# Patient Record
Sex: Female | Born: 1955
Health system: Southern US, Community
[De-identification: ages and names within clinical notes are randomized; demographics above are authoritative.]

## PROBLEM LIST (undated history)

## (undated) DIAGNOSIS — M069 Rheumatoid arthritis, unspecified: Secondary | ICD-10-CM

## (undated) DIAGNOSIS — I10 Essential (primary) hypertension: Secondary | ICD-10-CM

## (undated) DIAGNOSIS — M25579 Pain in unspecified ankle and joints of unspecified foot: Secondary | ICD-10-CM

## (undated) DIAGNOSIS — K219 Gastro-esophageal reflux disease without esophagitis: Secondary | ICD-10-CM

## (undated) DIAGNOSIS — E785 Hyperlipidemia, unspecified: Secondary | ICD-10-CM

## (undated) DIAGNOSIS — E063 Autoimmune thyroiditis: Secondary | ICD-10-CM

## (undated) DIAGNOSIS — E119 Type 2 diabetes mellitus without complications: Secondary | ICD-10-CM

## (undated) DIAGNOSIS — E039 Hypothyroidism, unspecified: Secondary | ICD-10-CM

## (undated) DIAGNOSIS — E559 Vitamin D deficiency, unspecified: Secondary | ICD-10-CM

## (undated) DIAGNOSIS — N200 Calculus of kidney: Secondary | ICD-10-CM

## (undated) HISTORY — DX: Vitamin D deficiency, unspecified: E55.9

## (undated) HISTORY — DX: Autoimmune thyroiditis: E06.3

## (undated) HISTORY — DX: Hyperlipidemia, unspecified: E78.5

## (undated) HISTORY — DX: Essential (primary) hypertension: I10

## (undated) HISTORY — PX: CARPAL TUNNEL RELEASE: SHX101

## (undated) HISTORY — DX: Gastro-esophageal reflux disease without esophagitis: K21.9

## (undated) HISTORY — PX: CHOLECYSTECTOMY: SHX55

## (undated) HISTORY — DX: Rheumatoid arthritis, unspecified: M06.9

## (undated) HISTORY — DX: Pain in unspecified ankle and joints of unspecified foot: M25.579

## (undated) HISTORY — DX: Type 2 diabetes mellitus without complications: E11.9

## (undated) HISTORY — PX: OTHER SURGICAL HISTORY: SHX169

## (undated) HISTORY — DX: Hypothyroidism, unspecified: E03.9

## (undated) HISTORY — PX: TONSILLECTOMY: SUR1361

## (undated) HISTORY — DX: Calculus of kidney: N20.0

## (undated) HISTORY — PX: HEMORRHOID SURGERY: SHX153

## (undated) HISTORY — PX: EXPLORATORY LAPAROTOMY: SUR591

---

## 2008-05-06 ENCOUNTER — Ambulatory Visit (HOSPITAL_BASED_OUTPATIENT_CLINIC_OR_DEPARTMENT_OTHER): Admission: RE | Admit: 2008-05-06 | Discharge: 2008-05-06 | Payer: Self-pay | Admitting: Orthopedic Surgery

## 2008-05-27 ENCOUNTER — Ambulatory Visit (HOSPITAL_BASED_OUTPATIENT_CLINIC_OR_DEPARTMENT_OTHER): Admission: RE | Admit: 2008-05-27 | Discharge: 2008-05-27 | Payer: Self-pay | Admitting: Orthopedic Surgery

## 2011-04-02 NOTE — Op Note (Signed)
NAME:  Stephanie [name], Stephanie [name]               ACCOUNT NO.:  [id number]   MEDICAL RECORD NO.:  [id number]          PATIENT TYPE:  AMB   LOCATION:  DSC                          FACILITY:  [facility]   PHYSICIAN:  Stephanie Fitch. [name], M.D. DATE OF BIRTH:  October 20, 1956   DATE OF PROCEDURE:  05/06/2008  DATE OF DISCHARGE:                               OPERATIVE REPORT   PREOPERATIVE DIAGNOSES:  1. Entrapped neuropathy, median nerve.  2. Right carpal tunnel.   POSTOPERATIVE DIAGNOSES:  1. Entrapped neuropathy, median nerve.  2. Right carpal tunnel.   OPERATION:  Release of right transverse carpal ligament.   [name] SURGEON:  Stephanie Fitch. Sypher, MD.   ASSISTANT:  Stephanie Rusk, PA-C.   ANESTHESIA:  IV regional supplemented by IV sedation.   SUPERVISING ANESTHESIOLOGIST:  Stephanie Sell, MD.   INDICATIONS:  Stephanie [name] is a 55 year old woman referred for  evaluation and management of right hand numbness.   Clinical examination revealed signs of carpal tunnel syndrome.  Electrodiagnostic studies confirmed significant carpal tunnel syndrome.   Due to failed respond to nonoperative measures, she was [name] to the  [name] room at this time for release of her right transcarpal  ligament.   Preoperatively, she was interviewed by Stephanie [name].   Due to a history of significant coughing following prior anesthesia, Dr.  Sampson [name] discussed the risks and benefits of general anesthesia versus  regional anesthesia with Stephanie [name] that she  would [name] an IV regional block.   A decision was made to proceed with an IV regional block with the  tourniquet being placed in the proximal forearm level.   Stephanie [name] and all parties understand that this sometimes can be  problematic with respect to intraoperative bleeding.   After informed consent, Stephanie [name] at this  time.   PROCEDURE:  Stephanie [name] is [name] to the [name] room and  placed  supine position on the [name] table.   An IV regional block was placed at approximately forearm level with a  tourniquet pressure of 250 mmHg.   After approximately 10 minutes, excellent anesthesia was achieved.   After routine Betadine scrub and paint, we proceeded with a short  incision in the line of the ring finger of the palm.  Subcutaneous  tissues were carefully divided in the palmar fascia.  The palmar fascia  was split longitudinally to reveal the common sensory branch of median  nerve and the ulnar bursa.  The superficial palmar arch was identified.   The distal margin of the transcarpal ligament was released with scissors  followed by release of the fascia overlying the thenar musculature.  There was a horseshoe muscle superficial to the transcarpal ligament.  This was gently teased apart with scissors.  Teasing the muscle led to  some bleeding.   As this is typical of regional blocks, we were required to repeatedly  sponge away oozing blood from the field.  The transcarpal ligament was  also ultimately isolated and released with scissors subcutaneously  extending into the distal forearm.   A Sewall retractor  was placed allowing visualization of the ulnar bursa.  The bursa was quite fibrotic.  The wound was noted to have proximal  oozing.  This will be controlled by direct compression.   Bleeding points of visible vessel lumens were electrocauterized bipolar  current followed by repair of the wound with intradermal 3-0 Prolene  suture.   The thumb was released with immediate capillary refill of the fingers  and thumb.  Direct compression was applied to the volar aspect of the  wrist to control venous ooze for approximately 5 minutes.  A compressive  dressing was applied with a volar plaster splint maintaining wrist in 5  degrees of dorsiflexion.      Stephanie [name], M.D.  Electronically Signed     RVS/MEDQ  D:  05/06/2008  T:  05/06/2008  Job:   [id number]

## 2011-04-02 NOTE — Op Note (Signed)
Stephanie [name]               ACCOUNT NO.:  [id number]   MEDICAL RECORD NO.:  [id number]          PATIENT TYPE:  AMB   LOCATION:  DSC                          FACILITY:  [facility]   PHYSICIAN:  Stephanie [name], M.D. DATE OF BIRTH:  [birth year]   DATE OF PROCEDURE:  05/27/2008  DATE OF DISCHARGE:                               OPERATIVE REPORT   PREOPERATIVE DIAGNOSES:  1. Left carpal tunnel syndrome.  2. Ganglion cyst, left thumb CMC joint dorsal.  3. Small ganglion cyst, left first dorsal compartment without signs of      no stenosing tenosynovitis.   POSTOPERATIVE DIAGNOSES:  1. Left carpal tunnel syndrome.  2. Ganglion cyst, left thumb CMC joint dorsal.  3. Small ganglion cyst, left first dorsal compartment without signs of      no stenosing tenosynovitis.   OPERATION:  1. Release of left transcarpal ligament.  2. Excision of ganglion cyst, left thumb CMC and thenar muscle region.  3. Incidental removal of ganglion, left first dorsal compartment.   OPERATIONS:  Stephanie Fitch. Sypher, MD   ASSISTANT:  Stephanie Reeks Dasnoit, PA.   ANESTHESIA:  IV regional at proximal brachial level supplemented by IV  sedation.   SUPERVISING ANESTHESIOLOGIST:  Stephanie Person, MD   INDICATIONS:  Stephanie [name] is a 55 year old woman employed by [employer] at [place], [place] who presents for evaluation of left hand  numbness and cyst at the base of her left thumb metacarpal dorsally and  overlying her first dorsal compartment.   She has had previous right carpal tunnel release surgery with a very  satisfactory improvement in her hand numbness.  She is now returned to  the operating room at this time for release of her left transcarpal  ligament.   Preoperatively, she also requested that we remove some cysts she was  growing one at the base of her thumb at the carpometacarpal joint and  thenar muscle junction and second over the first dorsal compartment.   Careful examination of her wrist  revealed a negative Finkelstein's  maneuver.  Her cysts were easily palpable.   After informed consent, she was brought to the operating room at this  time.  Preoperatively, she was advised that ganglion cyst can recur  despite thorough debridement.   PROCEDURE:  Stephanie [name] was brought to the operating room and placed  in supine position on the operating table.   Following placement of an IV regional block on the left at brachial  level, the left arm was prepped with Betadine soap solution and  sterilely draped.  IV sedation was provided.   The procedure commenced with a short incision in the line of the ring  finger and the palm.  Subcutaneous tissues were carefully divided  revealing palmar fascia.  They split longitudinally to reveal the common  sensory branches of the median nerve.  These were followed to transverse  carpal ligament, which was gently isolated from the median nerve.  The  median nerve was gently separated from the transcarpal ligament with the  aid of a Penfield four Engineer, structural.  The ligament was  then released along  its ulnar border with scissors extending into the distal forearm.   Bleeding points along the margin of the released ligament were  electrocauterized with bipolar current followed by repair of the skin  with intradermal through a Prolene suture.  Attention then directed to  the dorsal aspect of the thumb carpometacarpal joint.  The cyst was  easily palpable.  A short longitudinal incision of 1 cm fashioned.  The  cyst was circumferentially dissected and neurovascular structures gently  teased off with a Presenter, broadcasting.  The cyst was circumferentially  dissected and removed with a rongeur.  This extended to the capsule of  the [facility] joint and deep to the thenar muscles.   The base of the cyst was electrocauterized with bipolar forceps.   This wound was then repaired with intradermal through a Prolene suture.   Attention then directed to the  first dorsal compartment.  A short  transverse incision was fashioned directly over the palpable cyst.  The  subcutaneous tissue was carefully divided, retracting the radial  superficial sensory branches.  The cyst was noted to be synovial tissues  extruding through the fibers of the first dorsal compartment.  This was  circumferentially dissected with scissors and removed the rongeur.  There was no sign of stenosing tenosynovitis.  Therefore, the first  dorsal compartment was not released.   The third wound was repaired with intradermal through Prolene and Steri-  Strips.   All wounds were infiltrated with 2% lidocaine for postoperative  analgesia.   A hand dressing was applied with a volar plaster splint maintaining the  wrist in 5 degrees of dorsiflexion.   For aftercare Stephanie [name] was discharged with a prescription for Darvocet-  N 100 one p.o. q.4-6 h. p.r.n. pain 20 tablets without refill.  We will  see her back for followup at our office in 1 week for dressing change  and initiation of her exercise program.      Stephanie [name], M.D.  Electronically Signed     RVS/MEDQ  D:  05/27/2008  T:  05/28/2008  Job:  [id number]   cc:   Stephanie [name]

## 2011-08-15 LAB — POCT HEMOGLOBIN-HEMACUE: Hemoglobin: 16.5 — ABNORMAL HIGH

## 2011-08-15 LAB — BASIC METABOLIC PANEL
BUN: 8
Chloride: 103
Creatinine, Ser: 0.97
Glucose, Bld: 170 — ABNORMAL HIGH
Potassium: 3.4 — ABNORMAL LOW

## 2011-08-15 LAB — POCT I-STAT, CHEM 8
Calcium, Ion: 1.1 — ABNORMAL LOW
Chloride: 104
Creatinine, Ser: 1
Glucose, Bld: 110 — ABNORMAL HIGH
HCT: 42

## 2015-03-07 ENCOUNTER — Ambulatory Visit (INDEPENDENT_AMBULATORY_CARE_PROVIDER_SITE_OTHER): Payer: BLUE CROSS/BLUE SHIELD | Admitting: Podiatry

## 2015-03-07 ENCOUNTER — Encounter: Payer: Self-pay | Admitting: Podiatry

## 2015-03-07 ENCOUNTER — Ambulatory Visit (INDEPENDENT_AMBULATORY_CARE_PROVIDER_SITE_OTHER): Payer: BLUE CROSS/BLUE SHIELD

## 2015-03-07 VITALS — BP 108/65 | HR 89 | Resp 16 | Ht 62.0 in | Wt 128.0 lb

## 2015-03-07 DIAGNOSIS — M216X1 Other acquired deformities of right foot: Secondary | ICD-10-CM | POA: Diagnosis not present

## 2015-03-07 DIAGNOSIS — M779 Enthesopathy, unspecified: Secondary | ICD-10-CM

## 2015-03-07 DIAGNOSIS — E1142 Type 2 diabetes mellitus with diabetic polyneuropathy: Secondary | ICD-10-CM

## 2015-03-07 NOTE — Progress Notes (Signed)
   Subjective:    Patient ID: Stephanie [name], female    DOB: 10/20/1956, 59 y.o.   MRN: [id number]  HPI Comments: "I have trouble with this right foot"  Patient c/o numbness and tenderness plantar forefoot and toes right foot for about 1 year. Notices her toes don't touch the floor when she walks. Feels like there is a ball under the toes. She did see a doc and recommended she start "splaying" the toes. Tried wearing sneakers more often. The left is beginning to look the same but not as symptomatic yet.   Diabetic x 6 years and last A1C was 8.0     Review of Systems  All other systems reviewed and are negative.      Objective:   Physical Exam: I have reviewed her past mental history medications allergies surgery social history and review of systems. Pulses are strongly palpable bilateral. Neurologic sensorium is decreased persons Weinstein monofilament bilateral. She also has some changes in vibratory sensation bilateral. Muscle strength +5 over 5 dorsiflexors and plantar flexors inverters and everters are slightly weaker. This is noted but to be the the same bilateral. Orthopedic evaluation demonstrates mild pes planus bilateral mild valgus rotation to the second and third digits of the right foot possibly associated with overpronation or muscle atrophy within the intrinsic musculature. She also has some swelling beneath the second and third metatarsophalangeal joints that is unexplained. Radiographs do not demonstrate any fractures. Patient does state that she has loss of balance and fatigue in her legs. Cutaneous evaluation demonstrates supple well-hydrated cutis no lesions noted.        Assessment & Plan:  Assessment: I'm concerned for diabetic polyneuropathy resulting in atrophy/neuromuscular atrophy of the right foot. I'm also concerned about her instability.  Plan: I'm referring her to heal for neurology for a nerve conduction velocity exam as well as a EMG. I will follow-up with  her once we receive the results.

## 2015-03-09 ENCOUNTER — Telehealth: Payer: Self-pay | Admitting: *Deleted

## 2015-03-09 NOTE — Telephone Encounter (Signed)
"  Dr. Milinda Pointer ordered a Nerve Conduction Study for me.  Does it take a week for them to give me a call to schedule it?"  Dr. Milinda Pointer just placed the order on the 19th when you were here.  They should give you call soon.  "That's why I said does it take a week to hear from them.  Okay, that's all I wanted to know."

## 2015-03-30 ENCOUNTER — Encounter: Payer: Self-pay | Admitting: Neurology

## 2015-03-30 ENCOUNTER — Ambulatory Visit (INDEPENDENT_AMBULATORY_CARE_PROVIDER_SITE_OTHER): Payer: Self-pay | Admitting: Neurology

## 2015-03-30 ENCOUNTER — Ambulatory Visit (INDEPENDENT_AMBULATORY_CARE_PROVIDER_SITE_OTHER): Payer: BLUE CROSS/BLUE SHIELD | Admitting: Neurology

## 2015-03-30 DIAGNOSIS — M25579 Pain in unspecified ankle and joints of unspecified foot: Secondary | ICD-10-CM

## 2015-03-30 DIAGNOSIS — M25571 Pain in right ankle and joints of right foot: Secondary | ICD-10-CM

## 2015-03-30 DIAGNOSIS — M79676 Pain in unspecified toe(s): Secondary | ICD-10-CM | POA: Insufficient documentation

## 2015-03-30 DIAGNOSIS — M779 Enthesopathy, unspecified: Secondary | ICD-10-CM

## 2015-03-30 HISTORY — DX: Pain in unspecified ankle and joints of unspecified foot: M25.579

## 2015-03-30 NOTE — Procedures (Signed)
     HISTORY: Stephanie [name] is a 59 year old patient with a history of diabetes that is poorly controlled. The patient is having some burning sensations on the top of the foot on the right, and a sensation of fullness on the ball the foot on the right. The patient denies any low back pain or pain down the right leg. The patient is being evaluated for possible neuropathy or a lumbosacral radiculopathy.  NERVE CONDUCTION STUDIES:  Nerve conduction studies were performed on both lower extremities. The distal motor latencies and motor amplitudes for the peroneal and posterior tibial nerves were within normal limits, with exception that the motor amplitude for the right peroneal nerve was slightly low. The nerve conduction velocities for these nerves were also normal. The H reflex latencies were normal. The sensory latencies for the peroneal nerves were within normal limits. The medial and lateral plantar sensory latencies were symmetric and normal.   EMG STUDIES:  EMG study was performed on the right lower extremity:  The tibialis anterior muscle reveals 2 to 4K motor units with full recruitment. No fibrillations or positive waves were seen. The peroneus tertius muscle reveals 2 to 4K motor units with full recruitment. No fibrillations or positive waves were seen. The medial gastrocnemius muscle reveals 1 to 3K motor units with full recruitment. No fibrillations or positive waves were seen. The vastus lateralis muscle reveals 2 to 4K motor units with full recruitment. No fibrillations or positive waves were seen. The iliopsoas muscle reveals 2 to 4K motor units with full recruitment. No fibrillations or positive waves were seen. The biceps femoris muscle (long head) reveals 2 to 4K motor units with full recruitment. No fibrillations or positive waves were seen. The lumbosacral paraspinal muscles were tested at 3 levels, and revealed no abnormalities of insertional activity at all 3 levels tested.  There was good relaxation.   IMPRESSION:  Nerve conduction studies done on both lower extremities is relatively unremarkable, with exception that there is a slightly low motor amplitude for the right peroneal nerve. There is no evidence of a peripheral neuropathy. EMG evaluation of the right lower extremity is unremarkable, without evidence of an overlying lumbosacral radiculopathy.  Jill Alexanders MD 03/30/2015 4:12 PM  [facility] [address] [place], [place] [address]  Phone [phone] Fax [phone]

## 2015-03-30 NOTE — Progress Notes (Signed)
Please refer to EMG and nerve conduction study procedure note. 

## 2015-04-11 ENCOUNTER — Telehealth: Payer: Self-pay | Admitting: *Deleted

## 2015-04-11 NOTE — Telephone Encounter (Addendum)
"  I'm calling for the results of my Nerve Conduction Study.  I've been waiting patiently.  I don't think I should have to pay a bill for something I haven't received the results for."  I have the results.  I'm not sure whether Dr. Milinda Pointer has seen the results but I will ask him to read it and will call you back with the results or recommendation of what he wants you to do."  "I'm getting ready to leave for the Endocrinologist that he suggested.  So, I will be gone for about a hour.  I'll just continue to call to find out what is going on."  If you are not available, I will keep trying to contact you."  I called and informed her that Dr. Milinda Pointer said the study came back negative.  He said if the pain persists to schedule an appointment to come an see him.  Would you like to schedule an appointment?  "My foot hurts every minute, it never stopped.  Yes, I'd like to schedule an appointment please."  I transferred patient to a scheduler.

## 2015-04-18 ENCOUNTER — Encounter: Payer: Self-pay | Admitting: Podiatry

## 2015-04-18 ENCOUNTER — Ambulatory Visit (INDEPENDENT_AMBULATORY_CARE_PROVIDER_SITE_OTHER): Payer: BLUE CROSS/BLUE SHIELD | Admitting: Podiatry

## 2015-04-18 VITALS — BP 113/67 | HR 78 | Resp 16

## 2015-04-18 DIAGNOSIS — E1142 Type 2 diabetes mellitus with diabetic polyneuropathy: Secondary | ICD-10-CM

## 2015-04-18 NOTE — Progress Notes (Signed)
She presents today for follow-up of pain to the right foot which she describes more as a numbness and oxygen saturation to the first 3 toes and the plantar aspect of the first ray metatarsophalangeal joints. She is also here for evaluation of her nerve conduction velocity exam.  Objective: Vital signs are stable she is alert and oriented 3. No change in clinical findings. Nerve conduction velocity exam and EMG were both negative.  Assessment: Idiopathic neuropathy right.  Plan: I offered her Metanx and gabapentin and she declined. She states that she would wait until her foot got worse before she started any medication and treatment. She would just like to know what is wrong with her foot I expressed to her that it is neuromuscular and unfortunately we cannot diagnose it with nerve conduction velocity exam.

## 2016-06-12 ENCOUNTER — Telehealth: Payer: Self-pay | Admitting: *Deleted

## 2016-06-12 NOTE — Telephone Encounter (Signed)
"  My name is Jaryn Caughell.  My phone number is [phone].  Thank you."

## 2016-06-12 NOTE — Telephone Encounter (Signed)
Patient was calling regarding her husband.

## 2016-12-31 DIAGNOSIS — E1165 Type 2 diabetes mellitus with hyperglycemia: Secondary | ICD-10-CM | POA: Insufficient documentation

## 2016-12-31 DIAGNOSIS — E559 Vitamin D deficiency, unspecified: Secondary | ICD-10-CM | POA: Insufficient documentation

## 2016-12-31 DIAGNOSIS — E063 Autoimmune thyroiditis: Secondary | ICD-10-CM | POA: Insufficient documentation

## 2016-12-31 DIAGNOSIS — E039 Hypothyroidism, unspecified: Secondary | ICD-10-CM | POA: Insufficient documentation

## 2016-12-31 DIAGNOSIS — E782 Mixed hyperlipidemia: Secondary | ICD-10-CM | POA: Insufficient documentation

## 2016-12-31 DIAGNOSIS — I10 Essential (primary) hypertension: Secondary | ICD-10-CM | POA: Insufficient documentation

## 2018-06-26 ENCOUNTER — Other Ambulatory Visit: Payer: Self-pay | Admitting: Internal Medicine

## 2018-06-26 DIAGNOSIS — Z780 Asymptomatic menopausal state: Secondary | ICD-10-CM

## 2018-07-24 ENCOUNTER — Other Ambulatory Visit (HOSPITAL_COMMUNITY): Payer: Self-pay

## 2018-08-10 ENCOUNTER — Ambulatory Visit (HOSPITAL_COMMUNITY)
Admission: RE | Admit: 2018-08-10 | Discharge: 2018-08-10 | Disposition: A | Discharge status: Home / Self Care | Payer: BLUE CROSS/BLUE SHIELD | Source: Ambulatory Visit | Attending: Internal Medicine | Admitting: Internal Medicine

## 2018-08-10 ENCOUNTER — Encounter (HOSPITAL_COMMUNITY): Payer: Self-pay | Admitting: Radiology

## 2018-08-10 DIAGNOSIS — Z780 Asymptomatic menopausal state: Secondary | ICD-10-CM | POA: Insufficient documentation

## 2020-09-21 DIAGNOSIS — Z978 Presence of other specified devices: Secondary | ICD-10-CM | POA: Insufficient documentation

## 2021-01-26 ENCOUNTER — Encounter: Payer: Self-pay | Admitting: *Deleted

## 2021-02-15 ENCOUNTER — Ambulatory Visit (INDEPENDENT_AMBULATORY_CARE_PROVIDER_SITE_OTHER): Payer: Self-pay | Admitting: *Deleted

## 2021-02-15 ENCOUNTER — Encounter: Payer: Self-pay | Admitting: Nurse Practitioner

## 2021-02-15 ENCOUNTER — Other Ambulatory Visit: Payer: Self-pay

## 2021-02-15 VITALS — Ht 62.0 in | Wt 119.2 lb

## 2021-02-15 DIAGNOSIS — Z1211 Encounter for screening for malignant neoplasm of colon: Secondary | ICD-10-CM

## 2021-02-15 MED ORDER — CLENPIQ 10-3.5-12 MG-GM -GM/160ML PO SOLN: 1.0000 | Freq: Once | ORAL | 0 refills | Status: AC

## 2021-02-15 NOTE — Progress Notes (Signed)
Spoke with Walden Field, NP.  Pt will need ov due to rectal bleeding.

## 2021-02-15 NOTE — Patient Instructions (Signed)
[place]   Patient Name:  Stephanie [name] Date of procedure: 02/21/2021 Time to register at [facility]:  You will receive a call from the hospital a few days before your procedure. Provider:  Dr. Gala Romney  Please notify us immediately if you are diabetic, take iron supplements, or if you are on coumadin or any blood thinners.  Please hold the following medications: See letter.  Note: Do NOT refrigerate or freeze CLENPIQ. CLENPIQ is ready to drink. There is no need to add any other liquid or mix the medicine in the bottle before you start dosing.   02/20/2021-  1 Day prior to procedure:  Tuesday   CLEAR LIQUIDS ALL DAY--NO SOLID FOODS OR DAIRY PRODUCTS! See list of liquids that are allowed and items that are NOT allowed below.   Diabetic Medication Instructions:  See letter.   You must drink plenty of CLEAR LIQUIDS starting before your bowel prep. It is important to stay adequately hydrated before, during, and after your bowel prep for the prep to work effectively!   At 4:00 PM Begin the prep as follows:    1. Drink one bottle of pre-mixed CLENPIQ right from the bottle.  2. Drink at least five (5) 8-ounce drinks of clear liquids of your choice within the next 5 hours   At 10:00 PM: 1. Drink the second bottle of pre-mixed CLENPIQ right from the bottle.   2. Drink at least three (3) 8-ounce drinks of clear liquids of your choice within the next 3 hours before going to bed.   Continue clear liquids until midnight.  Nothing by mouth after midnight.  02/21/2021- Day of Procedure: Wednesday   Diabetic medications adjustments:  See letter.   You may take TYLENOL products.  Please continue your regular medications unless we have instructed you otherwise.     Please note, on the day of your procedure you MUST be accompanied by an adult who is willing to assume responsibility for you at time of discharge. If you do not have such person with you, your procedure will  have to be rescheduled.                                                                                                                     Please leave ALL jewelry at home prior to coming to the hospital for your procedure.   *It is your responsibility to check with your insurance company for the benefits of coverage you have for this procedure. Unfortunately, not all insurance companies have benefits to cover all or part of these types of procedures. It is your responsibility to check your benefits, however we will be glad to assist you with any codes your insurance company may need.   Please note that most insurance companies will not cover a screening colonoscopy for people under the age of 81  For example, with some insurance companies you may have benefits for a screening colonoscopy, but if polyps are found the diagnosis will  change and then you may have a deductible that will need to be met. Please make sure you check your benefits for screening colonoscopy as well as a diagnostic colonoscopy.    CLEAR LIQUIDS: (NO RED or PURPLE) Water  Jello   Apple Juice  White Grape Juice   Kool-Aid Soft drinks  Banana popsicles Sports Drink  Black coffee (No cream or milk) Tea (No cream or milk)  Broth (fat free beef/chicken/vegetable)  Clear liquids allow you to see your fingers on the other side of the glass.  Be sure they are NOT RED or PURPLE in color, cloudy, but CLEAR.  Do Not Eat: Dairy products of any kind Cranberry juice Tomato or V8 Juice  Orange Juice   Grapefruit Juice Red Grape Juice Alcohol   Non-dairy creamer Solid foods like cereal, oatmeal, yogurt, fruits, vegetables, creamed soups, eggs, bread, etc   HELPFUL HINTS TO MAKE DRINKING EASIER: -Trying drinking through a straw. -If you become nauseated, try consuming smaller amounts or stretch out the time between glasses.  Stop for 30 minutes & slowly start back drinking.  Call our office with any questions or concerns at  [phone].  Thank You,  Christ Kick, [place]

## 2021-02-15 NOTE — Progress Notes (Signed)
Pt said that she has history of bleeding from hemorrhoids for many years.  She said that she was instructed to use Miralax to help ( per physician that removed hemorrhoids-dr in [place] and Dr. Scotty Court).  Pt said that she hasn't used Miralax in a long time. Pt says that she has bleeding once a week.  She said it is usually seen after wiping.

## 2021-02-15 NOTE — Progress Notes (Signed)
Spoke with pt and informed her that ov was needed due to rectal bleeding.  Pt made aware that procedure and Covid screening is being cancelled and will be rescheduled at her ov on 03/28/2021 at 8:30 with Walden Field, NP.  Pt voiced understanding.  Pt requested a sooner appointment.  Informed her that I would call her if any cancellations.

## 2021-02-15 NOTE — Progress Notes (Signed)
Gastroenterology Pre-Procedure Review  Request Date: 02/15/2021 Requesting Physician: Dr. Wende Neighbors, Last TCS done 10 or more years ago in [place] per pt, she was unsure if she had polyps  PATIENT REVIEW QUESTIONS: The patient responded to the following health history questions as indicated:    1. Diabetes Melitis: yes, type II 2. Joint replacements in the past 12 months: no 3. Major health problems in the past 3 months: no 4. Has an artificial valve or MVP: no 5. Has a defibrillator: no 6. Has been advised in past to take antibiotics in advance of a procedure like teeth cleaning: no 7. Family history of colon cancer: no  8. Alcohol Use: no 9. Illicit drug Use: no 10. History of sleep apnea: no  11. History of coronary artery or other vascular stents placed within the last 12 months: no 12. History of any prior anesthesia complications: no 13. Body mass index is 21.8 kg/m.    MEDICATIONS & ALLERGIES:    Patient reports the following regarding taking any blood thinners:   Plavix? no Aspirin? no Coumadin? no Brilinta? no Xarelto? no Eliquis? no Pradaxa? no Savaysa? no Effient? no  Patient confirms/reports the following medications:  Current Outpatient Medications  Medication Sig Dispense Refill  . Adalimumab (HUMIRA PEN) 40 MG/0.4ML PNKT every 14 (fourteen) days.    . ATORVASTATIN CALCIUM PO Take by mouth daily.    . Cholecalciferol (VITAMIN D-3) 125 MCG (5000 UT) TABS Take by mouth 3 (three) times a week.    . cycloSPORINE (RESTASIS) 0.05 % ophthalmic emulsion 1 drop 2 (two) times daily. Instill 1 drop into both eyes twice a day.    . empagliflozin (JARDIANCE) 25 MG TABS tablet Take by mouth daily.    . Esomeprazole Magnesium (NEXIUM PO) Take by mouth daily.    . hydroxychloroquine (PLAQUENIL) 200 MG tablet 2 (two) times daily.    Marland Kitchen levothyroxine (SYNTHROID) 75 MCG tablet every morning.    Marland Kitchen lisinopril-hydrochlorothiazide (ZESTORETIC) 20-25 MG tablet Take 1 tablet by mouth  daily. Takes 1/2 tablet daily.    . meloxicam (MOBIC) 15 MG tablet Take 1 tablet by mouth daily.    . metFORMIN (GLUCOPHAGE-XR) 500 MG 24 hr tablet Take 1 tablet in morning and 2 tablets in PM for diabetes    . Omega-3 Fatty Acids (FISH OIL) 1200 MG CAPS Take by mouth 2 (two) times daily.    . potassium chloride (MICRO-K) 10 MEQ CR capsule Take by mouth daily.    . sertraline (ZOLOFT) 25 MG tablet daily.    . TRULICITY 4.5 YP/9.5KD SOPN once a week. Inject 4.5 mg weekly.    . valACYclovir (VALTREX) 500 MG tablet Take 1 tablet by mouth daily.     No current facility-administered medications for this visit.    Patient confirms/reports the following allergies:  No Known Allergies  No orders of the defined types were placed in this encounter.   AUTHORIZATION INFORMATION Primary Insurance: [place],  [place] #: [id number],  Group #: [id number] Pre-Cert / Josem Kaufmann required:  Pre-Cert / Auth #:   SCHEDULE INFORMATION: Procedure has been scheduled as follows:  Date: 02/21/2021, Time: AM procedure Location: [facility] with Dr. Gala Romney  This Gastroenterology Pre-Precedure Review Form is being routed to the following provider(s): Walden Field, NP

## 2021-02-15 NOTE — Progress Notes (Signed)
Ok to schedule.  DM meds: Jiardiance, Glucophage: None the morning of  On prep day: Check CBG ac and hs as well (if they normally check their blood sugar) as if the patient feels like their blood sugar is off. Can use soda, juice (that's in [place]) as needed for any low blood sugar.  Check CBG on arrival to endo unit.  ASA III (DM with a1c 7) based on limited history available in Epic

## 2021-02-19 ENCOUNTER — Other Ambulatory Visit (HOSPITAL_COMMUNITY): Payer: BC Managed Care – PPO

## 2021-03-28 ENCOUNTER — Ambulatory Visit: Payer: BC Managed Care – PPO | Admitting: Nurse Practitioner

## 2021-08-06 ENCOUNTER — Ambulatory Visit (INDEPENDENT_AMBULATORY_CARE_PROVIDER_SITE_OTHER): Payer: BC Managed Care – PPO | Admitting: Internal Medicine

## 2021-08-06 ENCOUNTER — Other Ambulatory Visit: Payer: Self-pay

## 2021-08-06 ENCOUNTER — Encounter: Payer: Self-pay | Admitting: Internal Medicine

## 2021-08-06 VITALS — BP 120/80 | HR 88 | Ht 62.0 in | Wt 120.4 lb

## 2021-08-06 DIAGNOSIS — E063 Autoimmune thyroiditis: Secondary | ICD-10-CM

## 2021-08-06 DIAGNOSIS — R739 Hyperglycemia, unspecified: Secondary | ICD-10-CM

## 2021-08-06 DIAGNOSIS — E119 Type 2 diabetes mellitus without complications: Secondary | ICD-10-CM | POA: Diagnosis not present

## 2021-08-06 LAB — POCT GLYCOSYLATED HEMOGLOBIN (HGB A1C): Hemoglobin A1C: 7.1 % — AB (ref 4.0–5.6)

## 2021-08-06 LAB — TSH: TSH: 1.32 u[IU]/mL (ref 0.35–5.50)

## 2021-08-06 LAB — LIPID PANEL
Cholesterol: 129 mg/dL (ref 0–200)
HDL: 63.4 mg/dL (ref >39.00)
LDL Cholesterol: 47 mg/dL (ref 0–99)
NonHDL: 65.59
Total CHOL/HDL Ratio: 2
Triglycerides: 92 mg/dL (ref 0.0–149.0)
VLDL: 18.4 mg/dL (ref 0.0–40.0)

## 2021-08-06 LAB — BASIC METABOLIC PANEL
BUN: 17 mg/dL (ref 6–23)
CO2: 28 mEq/L (ref 19–32)
Calcium: 9.8 mg/dL (ref 8.4–10.5)
Chloride: 98 mEq/L (ref 96–112)
Creatinine, Ser: 0.83 mg/dL (ref 0.40–1.20)
GFR: 74.33 mL/min (ref >60.00)
Glucose, Bld: 114 mg/dL — ABNORMAL HIGH (ref 70–99)
Potassium: 4.5 mEq/L (ref 3.5–5.1)
Sodium: 136 mEq/L (ref 135–145)

## 2021-08-06 LAB — MICROALBUMIN / CREATININE URINE RATIO
Creatinine,U: 35.9 mg/dL
Microalb Creat Ratio: 1.9 mg/g (ref 0.0–30.0)
Microalb, Ur: <0.7 mg/dL (ref 0.0–1.9)

## 2021-08-06 MED ORDER — EMPAGLIFLOZIN 25 MG PO TABS: 25.0000 mg | ORAL_TABLET | Freq: Every day | ORAL | 3 refills | Status: DC

## 2021-08-06 MED ORDER — METFORMIN HCL ER 500 MG PO TB24: 1000.0000 mg | ORAL_TABLET | Freq: Two times a day (BID) | ORAL | 3 refills | Status: DC

## 2021-08-06 MED ORDER — ATORVASTATIN CALCIUM 20 MG PO TABS
20.0000 mg | ORAL_TABLET | Freq: Every day | ORAL | 3 refills | Status: DC
Start: 2021-08-06 — End: 2021-08-06

## 2021-08-06 MED ORDER — TRULICITY 4.5 MG/0.5ML ~~LOC~~ SOAJ: 4.5000 mg | SUBCUTANEOUS | 3 refills | Status: DC

## 2021-08-06 MED ORDER — ATORVASTATIN CALCIUM 20 MG PO TABS: 20.0000 mg | ORAL_TABLET | Freq: Every day | ORAL | 3 refills | Status: DC

## 2021-08-06 MED ORDER — SYNTHROID 75 MCG PO TABS: 75.0000 ug | ORAL_TABLET | Freq: Every day | ORAL | 3 refills | Status: DC

## 2021-08-06 NOTE — Progress Notes (Signed)
Name: Stephanie Stephanie [name] Stephanie [name]  MRN/ DOB: [id number], 02-07-1956   Age/ Sex: 65 y.o., female    PCP: Celene Squibb, MD   Reason for Endocrinology Evaluation: Type 2 Diabetes Mellitus/ Hypothyroidism     Date of Initial Endocrinology Visit: 08/06/2021     PATIENT IDENTIFIER: Stephanie Stephanie [name] Stephanie [name] is a 65 y.o. female with a past medical history of T2DM, HTN, Hashimoto's Thyroiditis , RLS and RA . The patient presented for initial endocrinology clinic visit on 08/06/2021 for consultative assistance with her diabetes management.    HPI: Stephanie Stephanie [name] Stephanie [name] was    Diagnosed with gestational DM in 1988, was diagnosed with T2DM in 2005, she was started on Metformin at the time. GAD-65 negative in 2016 Prior Medications tried/Intolerance: Glipizide, Glyxambi Currently checking blood sugars multiple x / day Hypoglycemia episodes : no               Hemoglobin A1c has ranged from 6.1 %in 2015, peaking at 7.1% in 2022. Patient required assistance for hypoglycemia: no  Patient has required hospitalization within the last 1 year from hyper or hypoglycemia: no  In terms of diet, the patient eats 3 meals a day .   She has nausea with hunger pain    Follow with Rheumatology Dr. Trudie Reed for RA . Recent reduction to Plaquanil     THYROID HISTORY: She has been diagnosed with Hashimoto's Thyroiditis . Synthroid started in 2016  Not on Biotin    HOME ENDOCRINE REGIMEN: Metformin 500 mg XR 1 tabs in the morning and 2 tablet at night  Trulicity 4.5 mg weekly  Jardiance 25 mg daily  Synthroid 75 mcg daily      Statin: yes ACE-I/ARB: yes    CONTINUOUS GLUCOSE MONITORING RECORD INTERPRETATION    Dates of Recording: 9/6-9/19/2022  Sensor description: freestyle libre  Results statistics:   CGM use % of time 52  Average and SD 172/20.3  Time in range  67      %  % Time Above 180 29  % Time above 250 4  % Time Below target 0     Glycemic patterns summary: postprandial hyperglycemia noted, optimal  BG's between meals   Hyperglycemic episodes  post prandial   Hypoglycemic episodes occurred n/a  Overnight periods: optimal        DIABETIC COMPLICATIONS: Microvascular complications:   Denies: retinopathy, neuropathy, CKD  Last eye exam: Completed 03/2021-  Dr. Herbert Stephanie [name]   Macrovascular complications:   Denies: CAD, PVD, CVA   PAST HISTORY: Past Medical History:  Past Medical History:  Diagnosis Date   Diabetes ([place])    Dyslipidemia    GERD (gastroesophageal reflux disease)    Hashimoto's thyroiditis    Hypertension    Hypothyroidism    Pain in joint, ankle and foot 03/30/2015   Vitamin D deficiency    Past Surgical History: No past surgical history on file.  Social History:  reports that she has never smoked. She has never used smokeless tobacco. She reports that she does not drink alcohol. No history on file for drug use. Family History: No family history on file.   HOME MEDICATIONS: Allergies as of 08/06/2021   No Known Allergies      Medication List        Accurate as of August 06, 2021  9:43 AM. If you have any questions, ask your nurse or doctor.          ATORVASTATIN CALCIUM PO Take by mouth daily.   cycloSPORINE 0.05 %  ophthalmic emulsion Commonly known as: RESTASIS 1 drop 2 (two) times daily. Instill 1 drop into both eyes twice a day.   empagliflozin 25 MG Tabs tablet Commonly known as: JARDIANCE Take by mouth daily.   Fish Oil 1200 MG Caps Take by mouth 2 (two) times daily.   Humira Pen 40 MG/0.4ML Pnkt Generic drug: Adalimumab every 14 (fourteen) days.   hydroxychloroquine 200 MG tablet Commonly known as: PLAQUENIL 2 (two) times daily.   levothyroxine 75 MCG tablet Commonly known as: SYNTHROID every morning.   lisinopril-hydrochlorothiazide 20-25 MG tablet Commonly known as: ZESTORETIC Take 1 tablet by mouth daily. Takes 1/2 tablet daily.   meloxicam 15 MG tablet Commonly known as: MOBIC Take 1 tablet by mouth daily.    metFORMIN 500 MG 24 hr tablet Commonly known as: GLUCOPHAGE-XR Take 1 tablet in morning and 2 tablets in PM for diabetes   NEXIUM PO Take by mouth daily.   potassium chloride 10 MEQ CR capsule Commonly known as: MICRO-K Take by mouth daily.   sertraline 25 MG tablet Commonly known as: ZOLOFT daily.   Trulicity 4.5 0000000 Sopn Generic drug: Dulaglutide once a week. Inject 4.5 mg weekly.   valACYclovir 500 MG tablet Commonly known as: VALTREX Take 1 tablet by mouth daily.   Vitamin D-3 125 MCG (5000 UT) Tabs Take by mouth 3 (three) times a week.         ALLERGIES: No Known Allergies   REVIEW OF SYSTEMS: A comprehensive ROS was conducted with the patient and is negative except as per HPI and below:  Review of Systems  Gastrointestinal:  Negative for diarrhea, nausea and vomiting.     OBJECTIVE:   VITAL SIGNS: BP 120/80 (BP Location: Left Arm, Patient Position: Sitting, Cuff Size: Small)   Pulse 88   Ht '5\' 2"'$  (1.575 m)   Wt 120 lb 6.4 oz (54.6 kg)   SpO2 97%   BMI 22.02 kg/m    PHYSICAL EXAM:  General: Pt appears well and is in NAD  Neck: General: Supple without adenopathy or carotid bruits. Thyroid: Thyroid size normal.  No goiter or nodules appreciated.   Lungs: Clear with good BS bilat with no rales, rhonchi, or wheezes  Heart: RRR with normal S1 and S2 and no gallops; no murmurs; no rub  Abdomen: Normoactive bowel sounds, soft, nontender, without masses or organomegaly palpable  Extremities:  Lower extremities - No pretibial edema. No lesions.  Neuro: MS is good with appropriate affect, pt is alert and Ox3    DM foot exam: 08/06/2021  The skin of the feet is intact without sores or ulcerations. The pedal pulses are 2+ on right and 2+ on left. The sensation is intact to a screening 5.07, 10 gram monofilament bilaterally   DATA REVIEWED:  Lab Results  Component Value Date   HGBA1C 7.1 (A) 08/06/2021    [name] Stephanie [name], Stephanie [name] (MRN  [id number]) as of 08/06/2021 14:44  Ref. Range 08/06/2021 10:18  Sodium Latest Ref Range: 135 - 145 mEq/L 136  Potassium Latest Ref Range: 3.5 - 5.1 mEq/L 4.5  Chloride Latest Ref Range: 96 - 112 mEq/L 98  CO2 Latest Ref Range: 19 - 32 mEq/L 28  Glucose Latest Ref Range: 70 - 99 mg/dL 114 (H)  BUN Latest Ref Range: 6 - 23 mg/dL 17  Creatinine Latest Ref Range: 0.40 - 1.20 mg/dL 0.83  Calcium Latest Ref Range: 8.4 - 10.5 mg/dL 9.8  GFR Latest Ref Range: >60.00 mL/min 74.33  Total  CHOL/HDL Ratio Unknown 2  Cholesterol Latest Ref Range: 0 - 200 mg/dL 129  HDL Cholesterol Latest Ref Range: >39.00 mg/dL 63.40  LDL (calc) Latest Ref Range: 0 - 99 mg/dL 47  MICROALB/CREAT RATIO Latest Ref Range: 0.0 - 30.0 mg/g 1.9  NonHDL Unknown 65.59  Triglycerides Latest Ref Range: 0.0 - 149.0 mg/dL 92.0  VLDL Latest Ref Range: 0.0 - 40.0 mg/dL 18.4  TSH Latest Ref Range: 0.35 - 5.50 uIU/mL 1.32  Creatinine,U Latest Units: mg/dL 35.9  Microalb, Ur Latest Ref Range: 0.0 - 1.9 mg/dL <0.7   ASSESSMENT / PLAN / RECOMMENDATIONS:   1) Type 2 Diabetes Mellitus, Optimally controlled, Without complications - Most recent A1c of 7.1 %. Goal A1c < 7.0 %.     -Historically her A1c has been at or close to goal.  She just needs to make minor adjustment to her CHO intake -In review of her CGM download she has been noted with postprandial hyperglycemia for breakfast and supper, she is motivated to reduce her CHO intake during these meals -In the meantime I am going to increase her metformin to max dose of 2000 mg daily  -If she continues with hyperglycemia we will consider sulfonylurea  MEDICATIONS:  Increase Metformin 500 mg 2 tablets with Breakfast and 2 tablets with Supper - Continue Jardiance 25 mg, 1 tablet in the morning  - Continue Trulicity 4.5 mg weekly   EDUCATION / INSTRUCTIONS: BG monitoring instructions: Patient is instructed to check her blood sugars 3 times a day, before meals . Call [place]  Endocrinology clinic if: BG persistently < 70  I reviewed the Rule of 15 for the treatment of hypoglycemia in detail with the patient. Literature supplied.   2) Diabetic complications:  Eye: Does not have known diabetic retinopathy.  Neuro/ Feet: Does not have known diabetic peripheral neuropathy. Renal: Patient does not have known baseline CKD. She is  not on an ACEI/ARB at present.  3) Hashimoto's Thyroiditis   - Pt educated extensively on the correct way to take levothyroxine (first thing in the morning with water, 30 minutes before eating or taking other medications). - Pt encouraged to double dose the following day if she were to miss a dose given long half-life of levothyroxine.  -TSH normal  Medication  Synthroid 75 mcg   4) Dyslipidemia:  -LDL and TG at goal, continue atorvastatin   Medication  Atorvastatin 20 mg daily    Signed electronically by: Mack Guise, MD  [facility] Endocrinology  [facility] [address], [address], [place] [address] Phone: [phone] FAX: [phone]   CC: Celene Squibb, MD [place] [address] Phone: [phone]  Fax: [phone]    Return to Endocrinology clinic as below: Future Appointments  Date Time Provider [place]  08/06/2021  9:50 AM , Melanie Crazier, MD [facility] None

## 2021-08-06 NOTE — Patient Instructions (Signed)
-   Increase Metformin 500 mg 2 tablets with Breakfast and 2 tablets with Supper - Continue Jardiance 25 mg, 1 tablet in the morning  - Continue Trulicity 4.5 mg weekly     HOW TO TREAT LOW BLOOD SUGARS (Blood sugar LESS THAN 70 MG/DL) Please follow the RULE OF 15 for the treatment of hypoglycemia treatment (when your (blood sugars are less than 70 mg/dL)   STEP 1: Take 15 grams of carbohydrates when your blood sugar is low, which includes:  3-4 GLUCOSE TABS  OR 3-4 OZ OF JUICE OR REGULAR SODA OR ONE TUBE OF GLUCOSE GEL    STEP 2: RECHECK blood sugar in 15 MINUTES STEP 3: If your blood sugar is still low at the 15 minute recheck --> then, go back to STEP 1 and treat AGAIN with another 15 grams of carbohydrates.

## 2021-08-10 ENCOUNTER — Telehealth: Payer: Self-pay | Admitting: Internal Medicine

## 2021-08-10 NOTE — Telephone Encounter (Signed)
Pt calling in inquiring about  why her atorvastatin (LIPITOR) 20 MG tablet was increased from 10mg  to 20mg . Please call patient [phone]. Pt did not pick up 20mg  Atorvastatin and needs 10mg  called into [facility].

## 2021-08-13 ENCOUNTER — Other Ambulatory Visit: Payer: Self-pay | Admitting: Internal Medicine

## 2021-08-13 ENCOUNTER — Telehealth: Payer: Self-pay

## 2021-08-13 MED ORDER — LISINOPRIL-HYDROCHLOROTHIAZIDE 20-25 MG PO TABS: 1.0000 | ORAL_TABLET | Freq: Every day | ORAL | 0 refills | Status: DC

## 2021-08-13 MED ORDER — ATORVASTATIN CALCIUM 10 MG PO TABS: 10.0000 mg | ORAL_TABLET | Freq: Every day | ORAL | 3 refills | Status: DC

## 2021-08-13 NOTE — Telephone Encounter (Signed)
Patient would like to know if you will be filling the Lisinopril-hydrochlorothiazide for her as her previous endocrinologist was filling it. Also she is not comfortable with the change  in Atorvastatin as she state that she has been under control for years on 10mg .

## 2021-08-13 NOTE — Telephone Encounter (Signed)
Note not needed 

## 2021-08-13 NOTE — Telephone Encounter (Signed)
I only see 20mg  on current list. Please advise if patient needs to stay on 20 or we can call in 10mg  instead

## 2021-08-13 NOTE — Telephone Encounter (Signed)
Patient notified and will pick up scripts from pharmacy

## 2021-08-17 ENCOUNTER — Encounter: Payer: Self-pay | Admitting: Internal Medicine

## 2021-08-30 ENCOUNTER — Other Ambulatory Visit: Payer: Self-pay | Admitting: Internal Medicine

## 2021-09-26 LAB — HM DIABETES EYE EXAM

## 2021-10-01 ENCOUNTER — Telehealth: Payer: Self-pay | Admitting: Internal Medicine

## 2021-10-01 MED ORDER — FREESTYLE LIBRE 2 SENSOR MISC: 3 refills | Status: DC

## 2021-10-01 NOTE — Telephone Encounter (Signed)
Script sent  

## 2021-10-01 NOTE — Telephone Encounter (Signed)
MEDICATION: New RX for [employer] 14 day Sensors  PHARMACY:   [id number] [address], [place] Phone:  [phone]  Fax:  [phone]      HAS THE PATIENT CONTACTED THEIR PHARMACY?  Yes-RX for above was previously prescribed by a different Provider (retired)  IS THIS A 90 DAY SUPPLY : Yes  IS PATIENT OUT OF MEDICATION: No  IF NOT; HOW MUCH IS LEFT: Approx. 1 week  LAST APPOINTMENT DATE: @10 /13/2022  NEXT APPOINTMENT DATE:@1 /23/2023  DO WE HAVE YOUR PERMISSION TO LEAVE A DETAILED MESSAGE?: Yes  OTHER COMMENTS: Patient going out of town for Thanksgiving-requests the above RX be sent asap   **Let patient know to contact pharmacy at the end of the day to make sure medication is ready. **  ** Please notify patient to allow 48-72 hours to process**  **Encourage patient to contact the pharmacy for refills or they can request refills through [facility]**

## 2021-10-24 ENCOUNTER — Other Ambulatory Visit: Payer: Self-pay

## 2021-10-24 DIAGNOSIS — E119 Type 2 diabetes mellitus without complications: Secondary | ICD-10-CM

## 2021-10-24 MED ORDER — FREESTYLE LIBRE 14 DAY SENSOR MISC
1 refills | Status: DC
Start: 2021-10-24 — End: 2021-12-10

## 2021-10-24 NOTE — Progress Notes (Signed)
Pt contacted and advised rx corrected and sent to preferred pharmacy.

## 2021-10-25 NOTE — Telephone Encounter (Signed)
Patient called on 10/24/21 re: issues Patient felt needed to be addressed e.g. medications. Practice Administrator Ruben Reason spoke with Patient. Per Pam-all of Patient's concerns have been resolved and Dr. Kelton Pillar was updated.

## 2021-12-10 ENCOUNTER — Encounter: Payer: Self-pay | Admitting: Internal Medicine

## 2021-12-10 ENCOUNTER — Ambulatory Visit (INDEPENDENT_AMBULATORY_CARE_PROVIDER_SITE_OTHER): Payer: HMO | Admitting: Internal Medicine

## 2021-12-10 ENCOUNTER — Other Ambulatory Visit: Payer: Self-pay

## 2021-12-10 VITALS — BP 120/74 | HR 72 | Ht 62.0 in | Wt 123.0 lb

## 2021-12-10 DIAGNOSIS — E119 Type 2 diabetes mellitus without complications: Secondary | ICD-10-CM | POA: Diagnosis not present

## 2021-12-10 LAB — POCT GLYCOSYLATED HEMOGLOBIN (HGB A1C): Hemoglobin A1C: 7.5 % — AB (ref 4.0–5.6)

## 2021-12-10 MED ORDER — TRULICITY 4.5 MG/0.5ML ~~LOC~~ SOAJ: 4.5000 mg | SUBCUTANEOUS | 3 refills | Status: DC

## 2021-12-10 MED ORDER — FREESTYLE LIBRE 14 DAY SENSOR MISC: 1.0000 | 3 refills | Status: DC

## 2021-12-10 MED ORDER — SYNTHROID 75 MCG PO TABS: 75.0000 ug | ORAL_TABLET | Freq: Every day | ORAL | 3 refills | Status: DC

## 2021-12-10 MED ORDER — ATORVASTATIN CALCIUM 10 MG PO TABS: 10.0000 mg | ORAL_TABLET | Freq: Every day | ORAL | 3 refills | Status: DC

## 2021-12-10 MED ORDER — METFORMIN HCL ER 500 MG PO TB24: 1000.0000 mg | ORAL_TABLET | Freq: Two times a day (BID) | ORAL | 3 refills | Status: DC

## 2021-12-10 MED ORDER — EMPAGLIFLOZIN 25 MG PO TABS: 25.0000 mg | ORAL_TABLET | Freq: Every day | ORAL | 3 refills | Status: DC

## 2021-12-10 MED ORDER — LISINOPRIL-HYDROCHLOROTHIAZIDE 20-25 MG PO TABS: 1.0000 | ORAL_TABLET | ORAL | 3 refills | Status: DC

## 2021-12-10 MED ORDER — LISINOPRIL-HYDROCHLOROTHIAZIDE 20-25 MG PO TABS: 1.0000 | ORAL_TABLET | Freq: Every day | ORAL | 3 refills | Status: DC

## 2021-12-10 NOTE — Progress Notes (Signed)
Name: Stephanie [name]  MRN/ DOB: [id number], 31-Aug-1956   Age/ Sex: 66 y.o., female    PCP: Celene Squibb, MD   Reason for Endocrinology Evaluation: Type 2 Diabetes Mellitus/ Hypothyroidism     Date of Initial Endocrinology Visit: 08/06/2021    PATIENT IDENTIFIER: Stephanie [name] is a 66 y.o. female with a past medical history of T2DM, HTN, Hashimoto's Thyroiditis , RLS and RA . The patient presented for initial endocrinology clinic visit on 08/06/2021 for consultative assistance with her diabetes management.    HPI: Ms. Spindle was    Diagnosed with gestational DM in 1988, was diagnosed with T2DM in 2005, she was started on Metformin at the time. GAD-65 negative in 2016 Prior Medications tried/Intolerance: Glipizide, Glyxambi Currently checking blood sugars multiple x / day Hypoglycemia episodes : no               Hemoglobin A1c has ranged from 6.1 %in 2015, peaking at 7.1% in 2022. Patient required assistance for hypoglycemia: no  Patient has required hospitalization within the last 1 year from hyper or hypoglycemia: no  In terms of diet, the patient eats 3 meals a day .   She has nausea with hunger pain    Follow with Rheumatology Dr. Trudie Reed for RA . Recent reduction to Plaquanil     THYROID HISTORY: She has been diagnosed with Hashimoto's Thyroiditis . Synthroid started in 2016  Not on Biotin      SUBJECTIVE:   During the last visit (08/06/2021): A1c 7.1% , we increased Metformin, continued Jardiance and Trulicity     Today (96/75/91): Stephanie [name]  is here for a follow up on diabetes management. She checks her blood sugars multiple  times daily, through CGM. The patient has no had hypoglycemic episodes since the last clinic visit. She has been on 3 mg weekly for 3 months due to shortage of Trulicity She also admits to dietary indiscretions over the holidays Denies nausea,a vomiting or diarrhea      HOME ENDOCRINE REGIMEN: Metformin 500 mg XR 2 tabs in  the morning and 2 tablet at night  Trulicity 4.5 mg weekly  Jardiance 25 mg daily  Synthroid 75 mcg daily  Atorvastatin 10 mg daily       Statin: yes ACE-I/ARB: yes     CONTINUOUS GLUCOSE MONITORING RECORD INTERPRETATION    Dates of Recording: 1/9 - 12/09/2021  Sensor description: Freestyle libre 14  Results statistics:   CGM use % of time 51  Average and SD 154/25.4  Time in range      74%  % Time Above 180 23  % Time above 250 3  % Time Below target 0     Glycemic patterns summary: BG is optimal at night with postprandial hyperglycemia noted during the day  Hyperglycemic episodes postprandial, mainly supper  Hypoglycemic episodes occurred N/A  Overnight periods: Optimal      DIABETIC COMPLICATIONS: Microvascular complications:   Denies: retinopathy, neuropathy, CKD  Last eye exam: Completed 09/26/2021-  Dr. Herbert Deaner   Macrovascular complications:   Denies: CAD, PVD, CVA   PAST HISTORY: Past Medical History:  Past Medical History:  Diagnosis Date   Diabetes ([place])    Dyslipidemia    GERD (gastroesophageal reflux disease)    Hashimoto's thyroiditis    Hypertension    Hypothyroidism    Pain in joint, ankle and foot 03/30/2015   Vitamin D deficiency    Past Surgical History: No past surgical history on file.  Social History:  reports that she has never smoked. She has never used smokeless tobacco. She reports that she does not drink alcohol. No history on file for drug use. Family History: No family history on file.   HOME MEDICATIONS: Allergies as of 12/10/2021   No Known Allergies      Medication List        Accurate as of December 10, 2021  9:42 AM. If you have any questions, ask your nurse or doctor.          atorvastatin 10 MG tablet Commonly known as: LIPITOR Take 1 tablet (10 mg total) by mouth daily.   cycloSPORINE 0.05 % ophthalmic emulsion Commonly known as: RESTASIS 1 drop 2 (two) times daily. Instill 1 drop into both  eyes twice a day.   empagliflozin 25 MG Tabs tablet Commonly known as: JARDIANCE Take 1 tablet (25 mg total) by mouth daily.   Fish Oil 1200 MG Caps Take by mouth 2 (two) times daily.   FreeStyle Libre 14 Day Sensor Misc Use as instructed to check blood sugar. Change every 14 days. What changed: Another medication with the same name was removed. Continue taking this medication, and follow the directions you see here. Changed by: Dorita Sciara, MD   Humira Pen 40 MG/0.4ML Pnkt Generic drug: Adalimumab every 14 (fourteen) days.   hydroxychloroquine 200 MG tablet Commonly known as: PLAQUENIL 200 mg daily.   lisinopril-hydrochlorothiazide 20-25 MG tablet Commonly known as: ZESTORETIC Take 1 tablet by mouth daily. Takes 1/2 tablet daily.   meloxicam 15 MG tablet Commonly known as: MOBIC Take 1 tablet by mouth daily.   metFORMIN 500 MG 24 hr tablet Commonly known as: GLUCOPHAGE-XR Take 2 tablets (1,000 mg total) by mouth in the morning and at bedtime.   NEXIUM PO Take by mouth daily.   potassium chloride 10 MEQ CR capsule Commonly known as: MICRO-K Take by mouth daily.   sertraline 25 MG tablet Commonly known as: ZOLOFT daily.   Synthroid 75 MCG tablet Generic drug: levothyroxine Take 1 tablet (75 mcg total) by mouth daily before breakfast.   Trulicity 4.5 BJ/4.7WG Sopn Generic drug: Dulaglutide Inject 4.5 mg into the skin once a week. What changed: Another medication with the same name was removed. Continue taking this medication, and follow the directions you see here. Changed by: Dorita Sciara, MD   valACYclovir 500 MG tablet Commonly known as: VALTREX Take 1 tablet by mouth daily.   Vitamin D-3 125 MCG (5000 UT) Tabs Take by mouth 3 (three) times a week.         ALLERGIES: No Known Allergies      OBJECTIVE:   VITAL SIGNS: BP 120/74 (BP Location: Left Arm, Patient Position: Sitting, Cuff Size: Small)    Pulse 72    Ht 5\' 2"  (1.575  m)    Wt 123 lb (55.8 kg)    SpO2 99%    BMI 22.50 kg/m    PHYSICAL EXAM:  General: Pt appears well and is in NAD  Neck: General: Supple without adenopathy or carotid bruits. Thyroid: Thyroid size normal.  No goiter or nodules appreciated.   Lungs: Clear with good BS bilat with no rales, rhonchi, or wheezes  Heart: RRR   Abdomen: Normoactive bowel sounds, soft, nontender, without masses or organomegaly palpable  Extremities:  Lower extremities - No pretibial edema. No lesions.  Neuro: MS is good with appropriate affect, pt is alert and Ox3    DM foot exam: 08/06/2021  The skin of the feet is intact without sores or ulcerations. The pedal pulses are 2+ on right and 2+ on left. The sensation is intact to a screening 5.07, 10 gram monofilament bilaterally   DATA REVIEWED:  Lab Results  Component Value Date   HGBA1C 7.5 (A) 12/10/2021   HGBA1C 7.1 (A) 08/06/2021    Results for ALFHILD, PARTCH (MRN [id number]) as of 08/06/2021 14:44  Ref. Range 08/06/2021 10:18  Sodium Latest Ref Range: 135 - 145 mEq/L 136  Potassium Latest Ref Range: 3.5 - 5.1 mEq/L 4.5  Chloride Latest Ref Range: 96 - 112 mEq/L 98  CO2 Latest Ref Range: 19 - 32 mEq/L 28  Glucose Latest Ref Range: 70 - 99 mg/dL 114 (H)  BUN Latest Ref Range: 6 - 23 mg/dL 17  Creatinine Latest Ref Range: 0.40 - 1.20 mg/dL 0.83  Calcium Latest Ref Range: 8.4 - 10.5 mg/dL 9.8  GFR Latest Ref Range: >60.00 mL/min 74.33  Total CHOL/HDL Ratio Unknown 2  Cholesterol Latest Ref Range: 0 - 200 mg/dL 129  HDL Cholesterol Latest Ref Range: >39.00 mg/dL 63.40  LDL (calc) Latest Ref Range: 0 - 99 mg/dL 47  MICROALB/CREAT RATIO Latest Ref Range: 0.0 - 30.0 mg/g 1.9  NonHDL Unknown 65.59  Triglycerides Latest Ref Range: 0.0 - 149.0 mg/dL 92.0  VLDL Latest Ref Range: 0.0 - 40.0 mg/dL 18.4  TSH Latest Ref Range: 0.35 - 5.50 uIU/mL 1.32  Creatinine,U Latest Units: mg/dL 35.9  Microalb, Ur Latest Ref Range: 0.0 - 1.9 mg/dL <0.7    ASSESSMENT / PLAN / RECOMMENDATIONS:   1) Type 2 Diabetes Mellitus, Optimally controlled, Without complications - Most recent A1c of 7.5 %. Goal A1c < 7.0 %.     -A1c has trended up from 7.1% to 7.5%, this is multifactorial given the need to reduce Trulicity from 4.5 mg weekly to 3 mg weekly due to shortage in supplies, she also admits to dietary indiscretion during the holidays. -In review of her CGM download she has been noted with postprandial hyperglycemia with supper -She recently received the correct dose of Trulicity 4.5 mg weekly, we will not make any changes to her medication but I did encourage her to continue to reduce carbohydrate intake -Patient also advised to incorporate exercise prior to eating huge meal or planned dietary indiscretion. -She would like to continue using freestyle libre 14, she understands there is freestyle libre 2 and 3 now -We may consider glipizide if needed in the future -She will have labs at PCPs office next month, labs in September 2022 were optimal  MEDICATIONS: Continue metformin 500 mg 2 tablets with Breakfast and 2 tablets with Supper Continue Jardiance 25 mg, 1 tablet in the morning  Continue Trulicity 4.5 mg weekly   EDUCATION / INSTRUCTIONS: BG monitoring instructions: Patient is instructed to check her blood sugars 3 times a day, before meals . Call [facility] clinic if: BG persistently < 70  I reviewed the Rule of 15 for the treatment of hypoglycemia in detail with the patient. Literature supplied.   2) Diabetic complications:  Eye: Does not have known diabetic retinopathy.  Neuro/ Feet: Does not have known diabetic peripheral neuropathy. Renal: Patient does not have known baseline CKD. She is  not on an ACEI/ARB at present.  3) Hashimoto's Thyroiditis   - Pt educated extensively on the correct way to take levothyroxine (first thing in the morning with water, 30 minutes before eating or taking other medications). - Pt  encouraged to double  dose the following day if she were to miss a dose given long half-life of levothyroxine.  -TSH normal  Medication  Continue Synthroid 75 mcg   4) Dyslipidemia:  -LDL and TG at goal, continue atorvastatin   Medication  Atorvastatin 10 mg daily  5)HTN:   -BP optimal -We discussed lisinopril's benefit in protecting renal function from effective diabetes   Medication Continue lisinopril-HCTZ 20-25 , half a tablet daily   Follow-up in 6 months Signed electronically by: Mack Guise, MD  [facility] Endocrinology  [facility] [address], [place] [address] Phone: [phone] FAX: [phone]   CC: Celene Squibb, MD [place] [address] Phone: [phone]  Fax: [phone]    Return to Endocrinology clinic as below: No future appointments.

## 2021-12-10 NOTE — Patient Instructions (Addendum)
-   Continue Metformin 500 mg 2 tablets with Breakfast and 2 tablets with Supper - Continue Jardiance 25 mg, 1 tablet in the morning  - Continue Trulicity 4.5 mg weekly  - Synthroid 75 mcg daily      HOW TO TREAT LOW BLOOD SUGARS (Blood sugar LESS THAN 70 MG/DL) Please follow the RULE OF 15 for the treatment of hypoglycemia treatment (when your (blood sugars are less than 70 mg/dL)   STEP 1: Take 15 grams of carbohydrates when your blood sugar is low, which includes:  3-4 GLUCOSE TABS  OR 3-4 OZ OF JUICE OR REGULAR SODA OR ONE TUBE OF GLUCOSE GEL    STEP 2: RECHECK blood sugar in 15 MINUTES STEP 3: If your blood sugar is still low at the 15 minute recheck --> then, go back to STEP 1 and treat AGAIN with another 15 grams of carbohydrates.

## 2021-12-12 ENCOUNTER — Encounter: Payer: Self-pay | Admitting: Internal Medicine

## 2022-01-08 DIAGNOSIS — E1165 Type 2 diabetes mellitus with hyperglycemia: Secondary | ICD-10-CM | POA: Diagnosis not present

## 2022-01-08 DIAGNOSIS — E559 Vitamin D deficiency, unspecified: Secondary | ICD-10-CM | POA: Diagnosis not present

## 2022-01-08 DIAGNOSIS — I10 Essential (primary) hypertension: Secondary | ICD-10-CM | POA: Diagnosis not present

## 2022-01-08 DIAGNOSIS — E039 Hypothyroidism, unspecified: Secondary | ICD-10-CM | POA: Diagnosis not present

## 2022-01-16 ENCOUNTER — Other Ambulatory Visit: Payer: Self-pay | Admitting: Internal Medicine

## 2022-01-16 DIAGNOSIS — Z1231 Encounter for screening mammogram for malignant neoplasm of breast: Secondary | ICD-10-CM

## 2022-01-17 ENCOUNTER — Ambulatory Visit
Admission: RE | Admit: 2022-01-17 | Discharge: 2022-01-17 | Disposition: A | Discharge status: Home / Self Care | Payer: HMO | Source: Ambulatory Visit | Attending: Internal Medicine | Admitting: Internal Medicine

## 2022-01-17 DIAGNOSIS — Z1231 Encounter for screening mammogram for malignant neoplasm of breast: Secondary | ICD-10-CM

## 2022-02-05 DIAGNOSIS — R5383 Other fatigue: Secondary | ICD-10-CM | POA: Diagnosis not present

## 2022-02-05 DIAGNOSIS — M1991 Primary osteoarthritis, unspecified site: Secondary | ICD-10-CM | POA: Diagnosis not present

## 2022-02-05 DIAGNOSIS — M0600 Rheumatoid arthritis without rheumatoid factor, unspecified site: Secondary | ICD-10-CM | POA: Diagnosis not present

## 2022-02-05 DIAGNOSIS — Z6822 Body mass index (BMI) 22.0-22.9, adult: Secondary | ICD-10-CM | POA: Diagnosis not present

## 2022-02-05 DIAGNOSIS — Z79899 Other long term (current) drug therapy: Secondary | ICD-10-CM | POA: Diagnosis not present

## 2022-03-29 ENCOUNTER — Telehealth: Payer: Self-pay | Admitting: Internal Medicine

## 2022-03-29 NOTE — Telephone Encounter (Signed)
Good Afternoon Dr. Lorenso Courier, ? ? ?Patient called stating she needed to be seen for a Colonoscopy. Patient stated she went to [facility] as noted in her chart and was never seen. Patient is requesting you to be her provider. Are you okay with taking on care for patient? ? ?Please advise.  ?

## 2022-04-01 DIAGNOSIS — H401131 Primary open-angle glaucoma, bilateral, mild stage: Secondary | ICD-10-CM | POA: Diagnosis not present

## 2022-04-02 ENCOUNTER — Encounter: Payer: Self-pay | Admitting: Internal Medicine

## 2022-04-02 NOTE — Telephone Encounter (Signed)
Patient was scheduled 6/12 at 8:50 ?

## 2022-04-29 ENCOUNTER — Encounter: Payer: Self-pay | Admitting: Internal Medicine

## 2022-04-29 ENCOUNTER — Ambulatory Visit: Payer: HMO | Admitting: Internal Medicine

## 2022-04-29 VITALS — BP 124/64 | HR 88 | Ht 60.63 in | Wt 117.1 lb

## 2022-04-29 DIAGNOSIS — K649 Unspecified hemorrhoids: Secondary | ICD-10-CM | POA: Diagnosis not present

## 2022-04-29 DIAGNOSIS — K625 Hemorrhage of anus and rectum: Secondary | ICD-10-CM | POA: Diagnosis not present

## 2022-04-29 DIAGNOSIS — R197 Diarrhea, unspecified: Secondary | ICD-10-CM

## 2022-04-29 MED ORDER — NA SULFATE-K SULFATE-MG SULF 17.5-3.13-1.6 GM/177ML PO SOLN: 1.0000 | Freq: Once | ORAL | 0 refills | Status: AC

## 2022-04-29 NOTE — Progress Notes (Signed)
Chief Complaint: Rectal bleeding, hemorrhoids, diarrhea  HPI : 66 year old female with history of DM, hypothyroidism, GERD, RA on Humira/plaquenil presents with rectal bleeding, hemorrhoids, and diarrhea  She has had rectal bleeding for many years. She had a hemorrhoid surgery completed years ago but only had a part of her hemorrhoids treated. She does not strain but sometimes the bowel movements are so loose that it irritates her residual hemorrhoids. Overall though her diarrhea is not that bothersome. She would prefer to have looser stools instead of constipation. When her metformin goes up in dosing, she will have more diarrhea. Her last episode of rectal bleeding was two days ago. The bleeding is bright red blood that drips in the commode. There is some burning in sensation in her rectum. She thinks her last colonoscopy was over 9 years ago. She has on average 1-3 BMs per day. Denies fam hx of GI cancers. Her father had hemorrhoid surgery. She takes Nexium for GERD. She does take meloxicam. Endorses nausea when she needs to eat. Denies vomiting. Denies dysphagia, abdominal pain. She has lost some weight recently, which she attributes to her Trulicity. She does not have any appetite on her diabetes medications.  Wt Readings from Last 3 Encounters:  04/29/22 117 lb 2 oz (53.1 kg)  12/10/21 123 lb (55.8 kg)  08/06/21 120 lb 6.4 oz (54.6 kg)    Past Medical History:  Diagnosis Date   Diabetes ([place])    Dyslipidemia    GERD (gastroesophageal reflux disease)    Hashimoto's thyroiditis    HLD (hyperlipidemia)    Hypertension    Hypothyroidism    Kidney stones    Pain in joint, ankle and foot 03/30/2015   RA (rheumatoid arthritis) ([place])    Vitamin D deficiency    Past Surgical History:  Procedure Laterality Date   CARPAL TUNNEL RELEASE Bilateral    CHOLECYSTECTOMY     EXPLORATORY LAPAROTOMY     several for infertility   HEMORRHOID SURGERY     INVITRO SURGERY     GIFT   TONSILLECTOMY      Family History  Problem Relation Age of Onset   Rheum arthritis Mother    Heart failure Mother    Heart disease Father    Heart attack Father 9   Breast cancer Sister        22s   Diabetes Brother    Diabetes Brother    Kidney cancer Maternal Grandfather    Rheum arthritis Daughter    Hashimoto's thyroiditis Daughter    Social History   Tobacco Use   Smoking status: Former    Types: Cigarettes    Quit date: 1993    Years since quitting: 30.4   Smokeless tobacco: Never  Vaping Use   Vaping Use: Never used  Substance Use Topics   Alcohol use: No    Alcohol/week: 0.0 standard drinks of alcohol   Current Outpatient Medications  Medication Sig Dispense Refill   Adalimumab (HUMIRA PEN) 40 MG/0.4ML PNKT every 14 (fourteen) days.     atorvastatin (LIPITOR) 10 MG tablet Take 1 tablet (10 mg total) by mouth daily. 90 tablet 3   Cholecalciferol (VITAMIN D-3) 125 MCG (5000 UT) TABS Take by mouth 3 (three) times a week.     Continuous Blood Gluc Sensor (FREESTYLE LIBRE 14 DAY SENSOR) MISC 1 Device by Other route every 14 (fourteen) days. Use as instructed to check blood sugar. Change every 14 days. 6 each 3   cycloSPORINE (RESTASIS)  0.05 % ophthalmic emulsion 1 drop 2 (two) times daily. Instill 1 drop into both eyes twice a day.     Dulaglutide (TRULICITY) 4.5 WN/4.6EV SOPN Inject 4.5 mg into the skin once a week. 6 mL 3   empagliflozin (JARDIANCE) 25 MG TABS tablet Take 1 tablet (25 mg total) by mouth daily. 90 tablet 3   Esomeprazole Magnesium (NEXIUM PO) Take by mouth daily.     hydroxychloroquine (PLAQUENIL) 200 MG tablet Take 200 mg by mouth 2 (two) times daily.     lisinopril-hydrochlorothiazide (ZESTORETIC) 20-25 MG tablet Take 1 tablet by mouth as directed. Takes 1/2 tablet daily. 45 tablet 3   meloxicam (MOBIC) 15 MG tablet Take 1 tablet by mouth daily.     metFORMIN (GLUCOPHAGE-XR) 500 MG 24 hr tablet Take 2 tablets (1,000 mg total) by mouth in the morning and at bedtime.  360 tablet 3   Omega-3 Fatty Acids (FISH OIL) 1200 MG CAPS Take by mouth 2 (two) times daily.     potassium chloride (MICRO-K) 10 MEQ CR capsule Take by mouth daily.     sertraline (ZOLOFT) 25 MG tablet daily.     SYNTHROID 75 MCG tablet Take 1 tablet (75 mcg total) by mouth daily before breakfast. 90 tablet 3   valACYclovir (VALTREX) 500 MG tablet Take 1 tablet by mouth daily.     No current facility-administered medications for this visit.   No Known Allergies   Review of Systems: All systems reviewed and negative except where noted in HPI.   Physical Exam: BP 124/64 (BP Location: Left Arm, Patient Position: Sitting, Cuff Size: Normal)   Pulse 88   Ht 5' 0.63" (1.54 m) Comment: height measured without shoes  Wt 117 lb 2 oz (53.1 kg)   BMI 22.40 kg/m  Constitutional: Pleasant,well-developed, female in no acute distress. HEENT: Normocephalic and atraumatic. Conjunctivae are normal. No scleral icterus. Cardiovascular: Normal rate, regular rhythm.  Pulmonary/chest: Effort normal and breath sounds normal. No wheezing, rales or rhonchi. Abdominal: Soft, nondistended, nontender. Bowel sounds active throughout. There are no masses palpable. No hepatomegaly. Extremities: No edema Neurological: Alert and oriented to person place and time. Skin: Skin is warm and dry. No rashes noted. Psychiatric: Normal mood and affect. Behavior is normal.  Labs 07/2021: BMP and TSH unremarkable  Labs 11/2021: HbA1C 7.5%  Colonoscopy 05/03/13: No active bleeding noted. No colonic neoplasms noted. No colonic or rectal polyps. No diverticulosis. Internal hemorrhoids. Tortuous and redundant colon. Tortuous recto-sigmoid.  Repeat 10 years  ASSESSMENT AND PLAN: Rectal bleeding Hemorrhoids Diarrhea Patient presents to discuss colonoscopy procedure for rectal bleeding.  Patient has history of hemorrhoids that were partially treated with hemorrhoidectomy. Will plan for a colonoscopy procedure for further  evaluation for a source of her rectal bleeding. I went over the risk and benefits of the colonoscopy procedure with the patient, and she is agreeable to proceeding. - Colonoscopy [facility]. Suprep  [name], MD

## 2022-04-29 NOTE — Patient Instructions (Signed)
If you are age 66 or older, your body mass index should be between 23-30. Your Body mass index is 22.4 kg/m. If this is out of the aforementioned range listed, please consider follow up with your Primary Care Provider.  If you are age 30 or younger, your body mass index should be between 19-25. Your Body mass index is 22.4 kg/m. If this is out of the aformentioned range listed, please consider follow up with your Primary Care Provider.   You have been scheduled for a colonoscopy. Please follow written instructions given to you at your visit today.  Please pick up your prep supplies at the pharmacy within the next 1-3 days. If you use inhalers (even only as needed), please bring them with you on the day of your procedure.   The [place] [facility] providers would like to encourage you to use [facility] to communicate with providers for non-urgent requests or questions.  Due to long hold times on the telephone, sending your provider a message by [facility] may be a faster and more efficient way to get a response.  Please allow 48 business hours for a response.  Please remember that this is for non-urgent requests.   Thank you for entrusting me with your care and for choosing [facility], Dr. [name]

## 2022-05-06 ENCOUNTER — Telehealth: Payer: Self-pay

## 2022-05-06 DIAGNOSIS — Z596 Low income: Secondary | ICD-10-CM

## 2022-05-06 NOTE — Progress Notes (Signed)
[facility])  [place] Team    05/06/2022  Stephanie [name] [birth year] [id number]  Reason for referral: Medication Assistance with Trulicity and Jardiance  Referral source:  Certified Pharmacy Technician Current insurance: [place] Shield  Outreach:  Successful telephone call with [name].  HIPAA identifiers verified.   Brief Summary: Pharmacist was sent a referral from CPhT upon reviewing adherence rate of diabetic medications. Patient's PCP is Dr. Wende Neighbors and follows endocrinologist Dr. Kelton Pillar. Per chart review and discussion with patient, she's prescribed brand medication Trulicity, Jardiance, and Synthroid (she confirmed she has to be on brand medication). [name] reports that she recently paid ~$892 for Trulicity and now is in the medicare coverage gap. Jardiance and Synthroid were $65 and $60, respectively, for 90-days supply. However, she does not know the cost of Synthroid and Jardiance now that she is in the coverage gap.   She lives in a household of one person and income [phone] (per recent "SSI and pension" checks). Patient is out of town and unable to provide income for 2022 tax return. As it stands, patient qualifies for Trulicity and Synthroid PAP. [name] declines switching Jardiance to a SGLT2 in which she may qualify for PAP as she feels general medication changes "messes her stomach up" and she is okay with cost of Jardiance. This pharmacist provided MoA of SGLT2 inhibitors as well of possible cost of Jardiance for the rest of this year and respectfully accepted patient wishes. Patient was also provided information of how to contact [facility] for PAP referral for Jardiance.   Will forward aforementioned PAP opportunities to CPhT over medication assistance.   Thank you for allowing pharmacy to be a part of this patient's care. Kristeen Miss, PharmD Clinical Pharmacist [place] Cell: [phone]

## 2022-05-06 NOTE — Progress Notes (Signed)
[facility] [facility])  [place] Team    05/06/2022  Stephanie [name] 10/09/1956 [id number]  Reason for referral: Medication Assistance with Trulicity and Jardiance  Referral source:  Certified Pharmacy Technician Current insurance: [place] Shield  Outreach:  Successful telephone call with Stephanie [name].  HIPAA identifiers verified.   Update: Per discussion with pharmacist Technician, [employer] is no longer accepting applications for PAP.  R/o to patient again regarding opportunity to switch to Oz[employer]pic [employer] PAP) and she deferred for now to trend medication cost and discuss with Dr. Kelton Pillar regarding alternative DM regimens. Next appointment with endocrinologist: 06/10/2022.  Thank you for allowing pharmacy to be a part of this patient's care. Kristeen Miss, PharmD Clinical Pharmacist [place] Cell: [phone]

## 2022-05-07 ENCOUNTER — Telehealth: Payer: Self-pay | Admitting: Pharmacy Technician

## 2022-05-07 DIAGNOSIS — Z596 Low income: Secondary | ICD-10-CM

## 2022-05-07 NOTE — Progress Notes (Signed)
[place] [facility])                                            [place] Team    05/07/2022  Stephanie [name] Feb 15, 1956 [id number]                                      Medication Assistance Referral  Referral From:  [facility] Asajah Damita Dunnings  Medication/Company: Synthroid / [employer] Patient application portion:  Mailed Provider application portion: Faxed  to Dr. Collier Flowers Provider address/fax verified via: Office website  Medication/Company:  Trulicity/ [employer] Patient application portion:  Mailed in case it is added back to the list of active medications Provider application portion: Faxed  to Dr. Collier Flowers (will wait to see if patient continues on medication after office visit in July and/or to see if [place] adds back to the list of active medications) Provider address/fax verified via: Office website   [place] , [place]  [phone]

## 2022-06-04 ENCOUNTER — Encounter: Payer: Self-pay | Admitting: Internal Medicine

## 2022-06-10 ENCOUNTER — Encounter: Payer: Self-pay | Admitting: Internal Medicine

## 2022-06-10 ENCOUNTER — Ambulatory Visit (INDEPENDENT_AMBULATORY_CARE_PROVIDER_SITE_OTHER): Payer: HMO | Admitting: Internal Medicine

## 2022-06-10 VITALS — BP 114/72 | HR 82 | Ht 60.63 in | Wt 118.0 lb

## 2022-06-10 DIAGNOSIS — E785 Hyperlipidemia, unspecified: Secondary | ICD-10-CM | POA: Diagnosis not present

## 2022-06-10 DIAGNOSIS — E063 Autoimmune thyroiditis: Secondary | ICD-10-CM | POA: Diagnosis not present

## 2022-06-10 DIAGNOSIS — E119 Type 2 diabetes mellitus without complications: Secondary | ICD-10-CM | POA: Diagnosis not present

## 2022-06-10 LAB — POCT GLYCOSYLATED HEMOGLOBIN (HGB A1C): Hemoglobin A1C: 7.2 % — AB (ref 4.0–5.6)

## 2022-06-10 MED ORDER — TRULICITY 3 MG/0.5ML ~~LOC~~ SOAJ: 3.0000 mg | SUBCUTANEOUS | 6 refills | Status: DC

## 2022-06-10 NOTE — Progress Notes (Signed)
Name: Stephanie [name]  MRN/ DOB: [id number], 06-16-1956   Age/ Sex: 66 y.o., female    PCP: Celene Squibb, MD   Reason for Endocrinology Evaluation: Type 2 Diabetes Mellitus/ Hypothyroidism     Date of Initial Endocrinology Visit: 08/06/2021    PATIENT IDENTIFIER: [name] is a 66 y.o. female with a past medical history of T2DM, HTN, Hashimoto's Thyroiditis , RLS and RA . The patient presented for initial endocrinology clinic visit on 08/06/2021 for consultative assistance with her diabetes management.    HPI: [name] was    Diagnosed with gestational DM in 1988, was diagnosed with T2DM in 2005, she was started on Metformin at the time. GAD-65 negative in 2016 Prior Medications tried/Intolerance: Glipizide, Glyxambi Currently checking blood sugars multiple x / day Hypoglycemia episodes : no               Hemoglobin A1c has ranged from 6.1 %in 2015, peaking at 7.1% in 2022. Patient required assistance for hypoglycemia: no  Patient has required hospitalization within the last 1 year from hyper or hypoglycemia: no  In terms of diet, the patient eats 3 meals a day .   She has nausea with hunger pain    Follow with Rheumatology Dr. Trudie Reed for RA . On Plaquanil     THYROID HISTORY: She has been diagnosed with Hashimoto's Thyroiditis . Synthroid started in 2016  Not on Biotin      SUBJECTIVE:   During the last visit (12/10/2021): A1c 7.5% , we continued Metformin, Jardiance and Trulicity     Today (78/24/23): Stephanie [name]  is here for a follow up on diabetes management and hypothyroidism. She checks her blood sugars multiple  times daily, through CGM. The patient has no had hypoglycemic episodes since the last clinic visit.   Patient is in the coverage gap and was offered patient assistance coverage for SGLT2 inhibitors and Synthroid from [facility] pharmacist but the patient declined   She has chronic nausea that she attributes to trulicity ,only during hunger ,  this is resolved with eating  She has chronic loose stools that she attributes to  metformin but over the past year her loose stools has worsened , recently is having diarrhea up to 2x a daily  She has hx of hemorrhoids , hx of surgical intervention  She is scheduled for  colonoscopy tomorrow   She has food aversions, sees a therapist    HOME ENDOCRINE REGIMEN: Metformin 500 mg XR 2 tabs in the morning and 2 tablet at night  Trulicity 4.5 mg weekly  Jardiance 25 mg daily  Synthroid 75 mcg daily  Atorvastatin 10 mg daily       Statin: yes ACE-I/ARB: yes     CONTINUOUS GLUCOSE MONITORING RECORD INTERPRETATION    Dates of Recording: 7/11-7/24/2023  Sensor description: [employer]   Results statistics:   CGM use % of time 71  Average and SD 168/21.6  Time in range   69%  % Time Above 180 27  % Time above 250 4  % Time Below target 0     Glycemic patterns summary: BG is optimal at night with postprandial hyperglycemia noted during the day  Hyperglycemic episodes postprandial  Hypoglycemic episodes occurred N/A  Overnight periods: Optimal      DIABETIC COMPLICATIONS: Microvascular complications:   Denies: retinopathy, neuropathy, CKD  Last eye exam: Completed 09/26/2021-  Dr. Herbert Deaner   Macrovascular complications:   Denies: CAD, PVD, CVA  PAST HISTORY: Past Medical History:  Past Medical History:  Diagnosis Date   Diabetes ([place])    Dyslipidemia    GERD (gastroesophageal reflux disease)    Hashimoto's thyroiditis    HLD (hyperlipidemia)    Hypertension    Hypothyroidism    Kidney stones    Pain in joint, ankle and foot 03/30/2015   RA (rheumatoid arthritis) ([place])    Vitamin D deficiency    Past Surgical History:  Past Surgical History:  Procedure Laterality Date   CARPAL TUNNEL RELEASE Bilateral    CHOLECYSTECTOMY     EXPLORATORY LAPAROTOMY     several for infertility   HEMORRHOID SURGERY     INVITRO SURGERY     GIFT    TONSILLECTOMY      Social History:  reports that she quit smoking about 30 years ago. Her smoking use included cigarettes. She has never used smokeless tobacco. She reports that she does not drink alcohol. No history on file for drug use. Family History:  Family History  Problem Relation Age of Onset   Rheum arthritis Mother    Heart failure Mother    Heart disease Father    Heart attack Father 33   Breast cancer Sister        48s   Diabetes Brother    Diabetes Brother    Kidney cancer Maternal Grandfather    Rheum arthritis Daughter    Hashimoto's thyroiditis Daughter      HOME MEDICATIONS: Allergies as of 06/10/2022   No Known Allergies      Medication List        Accurate as of June 10, 2022  9:24 AM. If you have any questions, ask your nurse or doctor.          atorvastatin 10 MG tablet Commonly known as: LIPITOR Take 1 tablet (10 mg total) by mouth daily.   cycloSPORINE 0.05 % ophthalmic emulsion Commonly known as: RESTASIS 1 drop 2 (two) times daily. Instill 1 drop into both eyes twice a day.   empagliflozin 25 MG Tabs tablet Commonly known as: JARDIANCE Take 1 tablet (25 mg total) by mouth daily.   Fish Oil 1200 MG Caps Take by mouth 2 (two) times daily.   FreeStyle Libre 14 Day Sensor Misc 1 Device by Other route every 14 (fourteen) days. Use as instructed to check blood sugar. Change every 14 days.   Humira Pen 40 MG/0.4ML Pnkt Generic drug: Adalimumab every 14 (fourteen) days.   hydroxychloroquine 200 MG tablet Commonly known as: PLAQUENIL Take 200 mg by mouth 2 (two) times daily.   lisinopril-hydrochlorothiazide 20-25 MG tablet Commonly known as: ZESTORETIC Take 1 tablet by mouth as directed. Takes 1/2 tablet daily.   meloxicam 15 MG tablet Commonly known as: MOBIC Take 1 tablet by mouth daily.   metFORMIN 500 MG 24 hr tablet Commonly known as: GLUCOPHAGE-XR Take 2 tablets (1,000 mg total) by mouth in the morning and at bedtime.    NEXIUM PO Take by mouth daily.   potassium chloride 10 MEQ CR capsule Commonly known as: MICRO-K Take by mouth daily.   sertraline 25 MG tablet Commonly known as: ZOLOFT daily.   Synthroid 75 MCG tablet Generic drug: levothyroxine Take 1 tablet (75 mcg total) by mouth daily before breakfast.   Trulicity 4.5 UX/3.2GM Sopn Generic drug: Dulaglutide Inject 4.5 mg into the skin once a week.   valACYclovir 500 MG tablet Commonly known as: VALTREX Take 1 tablet by mouth daily.   Vitamin D-3  125 MCG (5000 UT) Tabs Take by mouth 3 (three) times a week.         ALLERGIES: No Known Allergies      OBJECTIVE:   VITAL SIGNS: BP 114/72 (BP Location: Left Arm, Patient Position: Sitting, Cuff Size: Small)   Pulse 82   Ht 5' 0.63" (1.54 m)   Wt 118 lb (53.5 kg)   SpO2 99%   BMI 22.57 kg/m    PHYSICAL EXAM:  General: Pt appears well and is in NAD  Neck: General: Supple without adenopathy or carotid bruits. Thyroid: Thyroid size normal.  No goiter or nodules appreciated.   Lungs: Clear with good BS bilat with no rales, rhonchi, or wheezes  Heart: RRR   Abdomen: Normoactive bowel sounds, soft, nontender, without masses or organomegaly palpable  Extremities:  Lower extremities - No pretibial edema. No lesions.  Neuro: MS is good with appropriate affect, pt is alert and Ox3    DM foot exam: 08/06/2021  The skin of the feet is intact without sores or ulcerations. The pedal pulses are 2+ on right and 2+ on left. The sensation is intact to a screening 5.07, 10 gram monofilament bilaterally   DATA REVIEWED:  Lab Results  Component Value Date   HGBA1C 7.2 (A) 06/10/2022   HGBA1C 7.5 (A) 12/10/2021   HGBA1C 7.1 (A) 08/06/2021    [name] [name], Stephanie [name] (MRN [id number]) as of 08/06/2021 14:44  Ref. Range 08/06/2021 10:18  Sodium Latest Ref Range: 135 - 145 mEq/L 136  Potassium Latest Ref Range: 3.5 - 5.1 mEq/L 4.5  Chloride Latest Ref Range: 96 - 112 mEq/L 98   CO2 Latest Ref Range: 19 - 32 mEq/L 28  Glucose Latest Ref Range: 70 - 99 mg/dL 114 (H)  BUN Latest Ref Range: 6 - 23 mg/dL 17  Creatinine Latest Ref Range: 0.40 - 1.20 mg/dL 0.83  Calcium Latest Ref Range: 8.4 - 10.5 mg/dL 9.8  GFR Latest Ref Range: >60.00 mL/min 74.33  Total CHOL/HDL Ratio Unknown 2  Cholesterol Latest Ref Range: 0 - 200 mg/dL 129  HDL Cholesterol Latest Ref Range: >39.00 mg/dL 63.40  LDL (calc) Latest Ref Range: 0 - 99 mg/dL 47  MICROALB/CREAT RATIO Latest Ref Range: 0.0 - 30.0 mg/g 1.9  NonHDL Unknown 65.59  Triglycerides Latest Ref Range: 0.0 - 149.0 mg/dL 92.0  VLDL Latest Ref Range: 0.0 - 40.0 mg/dL 18.4  TSH Latest Ref Range: 0.35 - 5.50 uIU/mL 1.32  Creatinine,U Latest Units: mg/dL 35.9  Microalb, Ur Latest Ref Range: 0.0 - 1.9 mg/dL <0.7   ASSESSMENT / PLAN / RECOMMENDATIONS:   1) Type 2 Diabetes Mellitus, Optimally controlled, Without complications - Most recent A1c of 7.2 %. Goal A1c < 7.0 %.     -Her A1c has been trending down -The patient endorses loose stools/diarrhea, unclear if this is related to metformin versus Trulicity.  We discussed reducing Trulicity dose from 4.5 mg weekly to 3.0 mg weekly -Patient was in the coverage gap, she was given patient assistance forms through the pharmacist, she has not filled those yet as she was on a vacation -Patient with food aversions, sees therapist, would like to see an RD to discuss protein options    MEDICATIONS: Continue metformin 500 mg 2 tablets with Breakfast and 2 tablets with Supper Continue Jardiance 25 mg, 1 tablet in the morning  Decrease Trulicity 3.0  mg weekly   EDUCATION / INSTRUCTIONS: BG monitoring instructions: Patient is instructed to check her blood sugars 3  times a day, before meals . Call [facility] if: BG persistently < 70  I reviewed the Rule of 15 for the treatment of hypoglycemia in detail with the patient. Literature supplied.   2) Diabetic complications:   Eye: Does not have known diabetic retinopathy.  Neuro/ Feet: Does not have known diabetic peripheral neuropathy. Renal: Patient does not have known baseline CKD. She is  not on an ACEI/ARB at present.  3) Hashimoto's Thyroiditis   -Patient is clinically euthyroid -TSH normal in March  Medication  Continue Synthroid 75 mcg   4) Dyslipidemia:  -LDL and TG at goal, continue atorvastatin   Medication  Atorvastatin 10 mg daily    Follow-up in 6 months    Signed electronically by: Mack Guise, MD  [facility] Endocrinology  [facility] [address] [place], [place] [address] Phone: [phone] FAX: [phone]   CC: Celene Squibb, MD [place] [address] Phone: [phone]  Fax: [phone]    Return to Endocrinology clinic as below: Future Appointments  Date Time Provider [place]  06/11/2022 11:30 AM Sharyn Creamer, MD [facility]

## 2022-06-10 NOTE — Patient Instructions (Signed)
-   Continue Metformin 500 mg 2 tablets with Breakfast and 2 tablets with Supper - Continue Jardiance 25 mg, 1 tablet in the morning  - Decrease Trulicity 3 mg weekly  - Synthroid 75 mcg daily      HOW TO TREAT LOW BLOOD SUGARS (Blood sugar LESS THAN 70 MG/DL) Please follow the RULE OF 15 for the treatment of hypoglycemia treatment (when your (blood sugars are less than 70 mg/dL)   STEP 1: Take 15 grams of carbohydrates when your blood sugar is low, which includes:  3-4 GLUCOSE TABS  OR 3-4 OZ OF JUICE OR REGULAR SODA OR ONE TUBE OF GLUCOSE GEL    STEP 2: RECHECK blood sugar in 15 MINUTES STEP 3: If your blood sugar is still low at the 15 minute recheck --> then, go back to STEP 1 and treat AGAIN with another 15 grams of carbohydrates.

## 2022-06-11 ENCOUNTER — Encounter: Payer: Self-pay | Admitting: Internal Medicine

## 2022-06-11 ENCOUNTER — Ambulatory Visit (AMBULATORY_SURGERY_CENTER): Payer: HMO | Admitting: Internal Medicine

## 2022-06-11 VITALS — BP 122/68 | HR 83 | Temp 97.0°F | Resp 22 | Ht 60.0 in | Wt 117.0 lb

## 2022-06-11 DIAGNOSIS — D124 Benign neoplasm of descending colon: Secondary | ICD-10-CM

## 2022-06-11 DIAGNOSIS — K625 Hemorrhage of anus and rectum: Secondary | ICD-10-CM | POA: Diagnosis not present

## 2022-06-11 DIAGNOSIS — R197 Diarrhea, unspecified: Secondary | ICD-10-CM

## 2022-06-11 DIAGNOSIS — Z1211 Encounter for screening for malignant neoplasm of colon: Secondary | ICD-10-CM

## 2022-06-11 DIAGNOSIS — E119 Type 2 diabetes mellitus without complications: Secondary | ICD-10-CM | POA: Diagnosis not present

## 2022-06-11 DIAGNOSIS — D123 Benign neoplasm of transverse colon: Secondary | ICD-10-CM

## 2022-06-11 DIAGNOSIS — K5730 Diverticulosis of large intestine without perforation or abscess without bleeding: Secondary | ICD-10-CM

## 2022-06-11 DIAGNOSIS — K649 Unspecified hemorrhoids: Secondary | ICD-10-CM | POA: Diagnosis not present

## 2022-06-11 DIAGNOSIS — E039 Hypothyroidism, unspecified: Secondary | ICD-10-CM | POA: Diagnosis not present

## 2022-06-11 DIAGNOSIS — K648 Other hemorrhoids: Secondary | ICD-10-CM | POA: Diagnosis not present

## 2022-06-11 DIAGNOSIS — I10 Essential (primary) hypertension: Secondary | ICD-10-CM | POA: Diagnosis not present

## 2022-06-11 MED ORDER — HYDROCORTISONE (PERIANAL) 2.5 % EX CREA: 1.0000 | TOPICAL_CREAM | Freq: Two times a day (BID) | CUTANEOUS | 1 refills | Status: DC

## 2022-06-11 MED ORDER — SODIUM CHLORIDE 0.9 % IV SOLN: 500.0000 mL | Freq: Once | INTRAVENOUS | Status: AC

## 2022-06-11 NOTE — Op Note (Signed)
[place] Patient Name: Stephanie [name] Procedure Date: 06/11/2022 10:58 AM MRN: [id number] Endoscopist: Sonny Masters "Stephanie [name] ,  Age: 66 Referring MD:  Date of Birth: May 25, 1956 Gender: Female Account #: [id number] Procedure:                Colonoscopy Indications:              Diarrhea, Rectal bleeding Medicines:                Monitored Anesthesia Care Procedure:                Pre-Anesthesia Assessment:                           - Prior to the procedure, a History and Physical                            was performed, and patient medications and                            allergies were reviewed. The patient's tolerance of                            previous anesthesia was also reviewed. The risks                            and benefits of the procedure and the sedation                            options and risks were discussed with the patient.                            All questions were answered, and informed consent                            was obtained. Prior Anticoagulants: The patient has                            taken no previous anticoagulant or antiplatelet                            agents. ASA Grade Assessment: II - A patient with                            mild systemic disease. After reviewing the risks                            and benefits, the patient was deemed in                            satisfactory condition to undergo the procedure.                           After obtaining informed consent, the colonoscope  was passed under direct vision. Throughout the                            procedure, the patient's blood pressure, pulse, and                            oxygen saturations were monitored continuously. The                            PCF-HQ190L Colonoscope was introduced through the                            anus and advanced to the the terminal ileum. The                            colonoscopy was performed  without difficulty. The                            patient tolerated the procedure well. The quality                            of the bowel preparation was good. The terminal                            ileum, ileocecal valve, appendiceal orifice, and                            rectum were photographed. Scope In: 11:34:44 AM Scope Out: 11:53:23 AM Scope Withdrawal Time: 0 hours 11 minutes 7 seconds  Total Procedure Duration: 0 hours 18 minutes 39 seconds  Findings:                 The terminal ileum appeared normal.                           Two sessile polyps were found in the descending                            colon and transverse colon. The polyps were 3 to 4                            mm in size. These polyps were removed with a cold                            snare. Resection and retrieval were complete.                           Multiple small and large-mouthed diverticula were                            found in the sigmoid colon.                           Non-bleeding internal hemorrhoids were found during  retroflexion.                           Biopsies for histology were taken with a cold                            forceps from the entire colon for evaluation of                            microscopic colitis. Complications:            No immediate complications. Estimated Blood Loss:     Estimated blood loss was minimal. Impression:               - The examined portion of the ileum was normal.                           - Two 3 to 4 mm polyps in the descending colon and                            in the transverse colon, removed with a cold snare.                            Resected and retrieved.                           - Diverticulosis in the sigmoid colon.                           - Non-bleeding internal hemorrhoids.                           - Biopsies were taken with a cold forceps from the                            entire colon for  evaluation of microscopic colitis. Recommendation:           - Discharge patient to home (with escort).                           - It is suspected that your rectal bleeding is due                            to hemorrhoids.                           - Await pathology results.                           - Anusol HC cream BID for 7 days.                           - If you are interested in hemorrhoidal banding in                            the future, please feel  free to call our clinic to                            schedule this.                           - The findings and recommendations were discussed                            with the patient. Sonny Masters "Stephanie [name],  06/11/2022 12:11:28 PM

## 2022-06-11 NOTE — Progress Notes (Signed)
GASTROENTEROLOGY PROCEDURE H&P NOTE   Primary Care Physician: Celene Squibb, MD    Reason for Procedure:   Rectal bleeding, diarrhea  Plan:    Colonoscopy  Patient is appropriate for endoscopic procedure(s) in the ambulatory ([facility]) setting.  The nature of the procedure, as well as the risks, benefits, and alternatives were carefully and thoroughly reviewed with the patient. Ample time for discussion and questions allowed. The patient understood, was satisfied, and agreed to proceed.     HPI: Stephanie [name] is a 66 y.o. female who presents for colonoscopy for evaluation of rectal bleeding and diarrhea .  Patient was most recently seen in the Gastroenterology Clinic on 04/29/22.  No interval change in medical history since that appointment. Please refer to that note for full details regarding GI history and clinical presentation.   Past Medical History:  Diagnosis Date   Diabetes ([place])    Dyslipidemia    GERD (gastroesophageal reflux disease)    Hashimoto's thyroiditis    HLD (hyperlipidemia)    Hypertension    Hypothyroidism    Kidney stones    Pain in joint, ankle and foot 03/30/2015   RA (rheumatoid arthritis) ([place])    Vitamin D deficiency     Past Surgical History:  Procedure Laterality Date   CARPAL TUNNEL RELEASE Bilateral    CHOLECYSTECTOMY     EXPLORATORY LAPAROTOMY     several for infertility   HEMORRHOID SURGERY     INVITRO SURGERY     GIFT   TONSILLECTOMY      Prior to Admission medications   Medication Sig Start Date End Date Taking? Authorizing Provider  atorvastatin (LIPITOR) 10 MG tablet Take 1 tablet (10 mg total) by mouth daily. 12/10/21  Yes Shamleffer, Melanie Crazier, MD  Cholecalciferol (VITAMIN D-3) 125 MCG (5000 UT) TABS Take by mouth 3 (three) times a week.   Yes [provider]  cycloSPORINE (RESTASIS) 0.05 % ophthalmic emulsion 1 drop 2 (two) times daily. Instill 1 drop into both eyes twice a day.   Yes [provider]   Dulaglutide (TRULICITY) 3 PX/1.0GY SOPN Inject 3 mg as directed once a week. 06/10/22  Yes Shamleffer, Melanie Crazier, MD  empagliflozin (JARDIANCE) 25 MG TABS tablet Take 1 tablet (25 mg total) by mouth daily. 12/10/21  Yes Shamleffer, Melanie Crazier, MD  Esomeprazole Magnesium (NEXIUM PO) Take by mouth daily.   Yes [provider]  hydroxychloroquine (PLAQUENIL) 200 MG tablet Take 200 mg by mouth 2 (two) times daily. 01/23/19  Yes [provider]  lisinopril-hydrochlorothiazide (ZESTORETIC) 20-25 MG tablet Take 1 tablet by mouth as directed. Takes 1/2 tablet daily. 12/10/21  Yes Shamleffer, Melanie Crazier, MD  metFORMIN (GLUCOPHAGE-XR) 500 MG 24 hr tablet Take 2 tablets (1,000 mg total) by mouth in the morning and at bedtime. 12/10/21  Yes Shamleffer, Melanie Crazier, MD  Omega-3 Fatty Acids (FISH OIL) 1200 MG CAPS Take by mouth 2 (two) times daily.   Yes [provider]  potassium chloride (MICRO-K) 10 MEQ CR capsule Take by mouth daily. 02/06/21  Yes [provider]  sertraline (ZOLOFT) 25 MG tablet daily. 01/26/18  Yes [provider]  SYNTHROID 75 MCG tablet Take 1 tablet (75 mcg total) by mouth daily before breakfast. 12/10/21  Yes Shamleffer, Melanie Crazier, MD  valACYclovir (VALTREX) 500 MG tablet Take 1 tablet by mouth daily. 10/13/17  Yes [provider]  Adalimumab (HUMIRA PEN) 40 MG/0.4ML PNKT every 14 (fourteen) days. 07/14/19   [provider]  Continuous Blood Gluc Sensor (FREESTYLE LIBRE 14 DAY SENSOR) MISC 1 Device by Other route every 14 (fourteen) days. Use as instructed to check blood sugar. Change every 14 days. 12/10/21   Shamleffer, Melanie Crazier, MD  meloxicam (MOBIC) 15 MG tablet Take 1 tablet by mouth daily. 07/29/18   [provider]    Current Outpatient Medications  Medication Sig Dispense Refill   atorvastatin (LIPITOR) 10 MG tablet Take 1 tablet (10 mg total) by mouth daily. 90 tablet 3    Cholecalciferol (VITAMIN D-3) 125 MCG (5000 UT) TABS Take by mouth 3 (three) times a week.     cycloSPORINE (RESTASIS) 0.05 % ophthalmic emulsion 1 drop 2 (two) times daily. Instill 1 drop into both eyes twice a day.     Dulaglutide (TRULICITY) 3 CB/7.6EG SOPN Inject 3 mg as directed once a week. 2 mL 6   empagliflozin (JARDIANCE) 25 MG TABS tablet Take 1 tablet (25 mg total) by mouth daily. 90 tablet 3   Esomeprazole Magnesium (NEXIUM PO) Take by mouth daily.     hydroxychloroquine (PLAQUENIL) 200 MG tablet Take 200 mg by mouth 2 (two) times daily.     lisinopril-hydrochlorothiazide (ZESTORETIC) 20-25 MG tablet Take 1 tablet by mouth as directed. Takes 1/2 tablet daily. 45 tablet 3   metFORMIN (GLUCOPHAGE-XR) 500 MG 24 hr tablet Take 2 tablets (1,000 mg total) by mouth in the morning and at bedtime. 360 tablet 3   Omega-3 Fatty Acids (FISH OIL) 1200 MG CAPS Take by mouth 2 (two) times daily.     potassium chloride (MICRO-K) 10 MEQ CR capsule Take by mouth daily.     sertraline (ZOLOFT) 25 MG tablet daily.     SYNTHROID 75 MCG tablet Take 1 tablet (75 mcg total) by mouth daily before breakfast. 90 tablet 3   valACYclovir (VALTREX) 500 MG tablet Take 1 tablet by mouth daily.     Adalimumab (HUMIRA PEN) 40 MG/0.4ML PNKT every 14 (fourteen) days.     Continuous Blood Gluc Sensor (FREESTYLE LIBRE 14 DAY SENSOR) MISC 1 Device by Other route every 14 (fourteen) days. Use as instructed to check blood sugar. Change every 14 days. 6 each 3   meloxicam (MOBIC) 15 MG tablet Take 1 tablet by mouth daily.     Current Facility-Administered Medications  Medication Dose Route Frequency Provider Last Rate Last Admin   0.9 %  sodium chloride infusion  500 mL Intravenous Once Sharyn Creamer, MD        Allergies as of 06/11/2022   (No Known Allergies)    Family History  Problem Relation Age of Onset   Rheum arthritis Mother    Heart failure Mother    Heart disease Father    Heart attack Father 39    Breast cancer Sister        58s   Diabetes Brother    Diabetes Brother    Kidney cancer Maternal Grandfather    Rheum arthritis Daughter    Hashimoto's thyroiditis Daughter     Social History   Socioeconomic History   Marital status: Divorced    Spouse name: Not on file   Number of children: 1   Years of education: Not on file   Highest education level: Not on file  Occupational History   Occupation: retired  Tobacco Use   Smoking status: Former    Types: Cigarettes    Quit date: 1993    Years since quitting: 30.5   Smokeless tobacco: Never  Media planner  Vaping Use: Never used  Substance and Sexual Activity   Alcohol use: No    Alcohol/week: 0.0 standard drinks of alcohol   Drug use: Not on file   Sexual activity: Not on file  Other Topics Concern   Not on file  Social History Narrative   Not on file   Social Determinants of Health   Financial Resource Strain: Not on file  Food Insecurity: Not on file  Transportation Needs: Not on file  Physical Activity: Not on file  Stress: Not on file  Social Connections: Not on file  Intimate Partner Violence: Not on file    Physical Exam: Vital signs in last 24 hours: BP 134/80   Pulse 88   Temp (!) 97 F (36.1 C)   Ht 5' (1.524 m)   Wt 117 lb (53.1 kg)   SpO2 97%   BMI 22.85 kg/m  GEN: NAD EYE: Sclerae anicteric ENT: MMM CV: Non-tachycardic Pulm: No increased WOB GI: Soft NEURO:  Alert & Oriented   [name], MD [facility]   06/11/2022 11:27 AM

## 2022-06-11 NOTE — Progress Notes (Signed)
To pacu, VSS. Report to [email or web address].tb 

## 2022-06-11 NOTE — Patient Instructions (Signed)
Handout on polyps.   YOU HAD AN ENDOSCOPIC PROCEDURE TODAY AT [facility]:   Refer to the procedure report that was given to you for any specific questions about what was found during the examination.  If the procedure report does not answer your questions, please call your gastroenterologist to clarify.  If you requested that your care partner not be given the details of your procedure findings, then the procedure report has been included in a sealed envelope for you to review at your convenience later.  YOU SHOULD EXPECT: Some feelings of bloating in the abdomen. Passage of more gas than usual.  Walking can help get rid of the air that was put into your GI tract during the procedure and reduce the bloating. If you had a lower endoscopy (such as a colonoscopy or flexible sigmoidoscopy) you may notice spotting of blood in your stool or on the toilet paper. If you underwent a bowel prep for your procedure, you may not have a normal bowel movement for a few days.  Please Note:  You might notice some irritation and congestion in your nose or some drainage.  This is from the oxygen used during your procedure.  There is no need for concern and it should clear up in a day or so.  SYMPTOMS TO REPORT IMMEDIATELY:  Following lower endoscopy (colonoscopy or flexible sigmoidoscopy):  Excessive amounts of blood in the stool  Significant tenderness or worsening of abdominal pains  Swelling of the abdomen that is new, acute  Fever of 100F or higher   For urgent or emergent issues, a gastroenterologist can be reached at any hour by calling [phone]. Do not use MyChart messaging for urgent concerns.    DIET:  We do recommend a small meal at first, but then you may proceed to your regular diet.  Drink plenty of fluids but you should avoid alcoholic beverages for 24 hours.  ACTIVITY:  You should plan to take it easy for the rest of today and you should NOT DRIVE or use heavy machinery  until tomorrow (because of the sedation medicines used during the test).    FOLLOW UP: Our staff will call the number listed on your records the next business day following your procedure.  We will call around 7:15- 8:00 am to check on you and address any questions or concerns that you may have regarding the information given to you following your procedure. If we do not reach you, we will leave a message.  If you develop any symptoms (ie: fever, flu-like symptoms, shortness of breath, cough etc.) before then, please call [phone].  If you test positive for Covid 19 in the 2 weeks post procedure, please call and report this information to [place].    If any biopsies were taken you will be contacted by phone or by letter within the next 1-3 weeks.  Please call us at [phone] if you have not heard about the biopsies in 3 weeks.    SIGNATURES/CONFIDENTIALITY: You and/or your care partner have signed paperwork which will be entered into your electronic medical record.  These signatures attest to the fact that that the information above on your After Visit Summary has been reviewed and is understood.  Full responsibility of the confidentiality of this discharge information lies with you and/or your care-partner.

## 2022-06-11 NOTE — Progress Notes (Signed)
Called to room to assist during endoscopic procedure.  Patient ID and intended procedure confirmed with present staff. Received instructions for my participation in the procedure from the performing physician.  

## 2022-06-12 ENCOUNTER — Telehealth: Payer: Self-pay

## 2022-06-12 NOTE — Telephone Encounter (Signed)
  Follow up Call-     06/11/2022   10:40 AM  Call back number  Post procedure Call Back phone  # [phone]  Permission to leave phone message Yes     Patient questions:  Do you have a fever, pain , or abdominal swelling? No. Pain Score  0 *  Have you tolerated food without any problems? Yes.    Have you been able to return to your normal activities? Yes.    Do you have any questions about your discharge instructions: Diet   No. Medications  No. Follow up visit  No.  Do you have questions or concerns about your Care? No.  Actions: * If pain score is 4 or above: No action needed, pain <4.

## 2022-06-13 ENCOUNTER — Telehealth: Payer: Self-pay

## 2022-06-13 NOTE — Telephone Encounter (Signed)
-----   Message from Sharyn Creamer, MD sent at 06/11/2022 12:17 PM EDT ----- Jamas Lav, this patient told me that she is already interested in hemorrhoidal banding. Could we get her in for hemorrhoidal banding appt sometime in the next 1-2 months?  Thanks, Lyndee Leo

## 2022-06-13 NOTE — Telephone Encounter (Signed)
Called the patient to discuss. No answer. Left a message to call back to discuss or schedule.

## 2022-06-14 ENCOUNTER — Encounter: Payer: Self-pay | Admitting: Internal Medicine

## 2022-06-21 NOTE — Telephone Encounter (Signed)
Called and spoke with patient. She has been scheduled for her 1st hemorrhoid banding on Tuesday, 07/23/22 at 3 pm with Dr. Lorenso Courier. Pt also wanted to review pathology results. I told pt that she should have a letter in the mail with results, I read the letter from Dr. Lorenso Courier. Pt verbalized understanding of all information and had no concerns at the end of the call.

## 2022-06-21 NOTE — Telephone Encounter (Signed)
Inbound call from patient returning call.please give a call back to further advise.  Thank you

## 2022-07-03 DIAGNOSIS — L821 Other seborrheic keratosis: Secondary | ICD-10-CM | POA: Diagnosis not present

## 2022-07-03 DIAGNOSIS — C44712 Basal cell carcinoma of skin of right lower limb, including hip: Secondary | ICD-10-CM | POA: Diagnosis not present

## 2022-07-03 DIAGNOSIS — D225 Melanocytic nevi of trunk: Secondary | ICD-10-CM | POA: Diagnosis not present

## 2022-07-03 DIAGNOSIS — Z1283 Encounter for screening for malignant neoplasm of skin: Secondary | ICD-10-CM | POA: Diagnosis not present

## 2022-07-03 DIAGNOSIS — L648 Other androgenic alopecia: Secondary | ICD-10-CM | POA: Diagnosis not present

## 2022-07-17 NOTE — Progress Notes (Signed)
[facility])  [facility] Team    07/17/2022  ANVIKA GASHI 26-Nov-1955 [id number]  Reason for referral: Medication Assistance with Trulicity and Jardiance  Referral source:  Certified Pharmacy Technician Current insurance: [employer]  Outreach:  Successful telephone call with [name].  HIPAA identifiers verified.   Update: Per discussion with pharmacist Technician, Susy Frizzle, she noted the following:   Closing case today as patient has not mailed back applications for Synthroid with [employer] and Trulicity application with [employer] (currently not avail with [employer]). Applications were mailed to her on 05/07/22.   On 05/23/22-patient states she is out of town but will check when she gets back.   On 06/21/22-patient stats that she is still traveling but will check when she gets back.   Thank you for allowing pharmacy to be a part of this patient's care. Kristeen Miss, PharmD Clinical Pharmacist [place] Cell: [phone]

## 2022-07-23 ENCOUNTER — Ambulatory Visit: Payer: HMO | Admitting: Internal Medicine

## 2022-07-23 ENCOUNTER — Encounter: Payer: Self-pay | Admitting: Internal Medicine

## 2022-07-23 VITALS — BP 102/64 | HR 60 | Ht 62.0 in | Wt 118.0 lb

## 2022-07-23 DIAGNOSIS — K642 Third degree hemorrhoids: Secondary | ICD-10-CM | POA: Diagnosis not present

## 2022-07-23 DIAGNOSIS — K649 Unspecified hemorrhoids: Secondary | ICD-10-CM

## 2022-07-23 NOTE — Progress Notes (Signed)
PROCEDURE NOTE: The patient presents with symptomatic grade 3 hemorrhoids, requesting rubber band ligation of his/her hemorrhoidal disease.  All risks, benefits and alternative forms of therapy were described and informed consent was obtained.  In the Left Lateral Decubitus position anoscopic examination revealed grade 3 hemorrhoids. The anorectum was pre-medicated with nitroglycerin and Recticare The decision was made to band the right anterior internal hemorrhoid, and the [place] was used to perform band ligation without complication.  Digital anorectal examination was then performed to assure proper positioning of the band, and to adjust the banded tissue as required.  The patient was discharged home without pain or other issues.  Dietary and behavioral recommendations were given and along with follow-up instructions.     The following adjunctive treatments were recommended: Daily fiber supplement, hydration, stool softener as needed  The patient will return in 4 weeks for  follow-up and possible additional banding as required. No complications were encountered and the patient tolerated the procedure well.

## 2022-07-23 NOTE — Patient Instructions (Signed)

## 2022-08-19 DIAGNOSIS — Z85828 Personal history of other malignant neoplasm of skin: Secondary | ICD-10-CM | POA: Diagnosis not present

## 2022-08-19 DIAGNOSIS — Z08 Encounter for follow-up examination after completed treatment for malignant neoplasm: Secondary | ICD-10-CM | POA: Diagnosis not present

## 2022-08-20 DIAGNOSIS — F5101 Primary insomnia: Secondary | ICD-10-CM | POA: Diagnosis not present

## 2022-08-20 DIAGNOSIS — Z79899 Other long term (current) drug therapy: Secondary | ICD-10-CM | POA: Diagnosis not present

## 2022-08-20 DIAGNOSIS — M79605 Pain in left leg: Secondary | ICD-10-CM | POA: Diagnosis not present

## 2022-08-20 DIAGNOSIS — M1991 Primary osteoarthritis, unspecified site: Secondary | ICD-10-CM | POA: Diagnosis not present

## 2022-08-20 DIAGNOSIS — Z6821 Body mass index (BMI) 21.0-21.9, adult: Secondary | ICD-10-CM | POA: Diagnosis not present

## 2022-08-20 DIAGNOSIS — M79604 Pain in right leg: Secondary | ICD-10-CM | POA: Diagnosis not present

## 2022-08-20 DIAGNOSIS — M0600 Rheumatoid arthritis without rheumatoid factor, unspecified site: Secondary | ICD-10-CM | POA: Diagnosis not present

## 2022-08-22 ENCOUNTER — Encounter: Payer: HMO | Attending: Internal Medicine | Admitting: Dietician

## 2022-08-22 ENCOUNTER — Encounter: Payer: Self-pay | Admitting: Dietician

## 2022-08-22 DIAGNOSIS — Z713 Dietary counseling and surveillance: Secondary | ICD-10-CM | POA: Insufficient documentation

## 2022-08-22 DIAGNOSIS — E119 Type 2 diabetes mellitus without complications: Secondary | ICD-10-CM | POA: Insufficient documentation

## 2022-08-22 DIAGNOSIS — M069 Rheumatoid arthritis, unspecified: Secondary | ICD-10-CM | POA: Diagnosis not present

## 2022-08-22 DIAGNOSIS — G2581 Restless legs syndrome: Secondary | ICD-10-CM | POA: Insufficient documentation

## 2022-08-22 DIAGNOSIS — I10 Essential (primary) hypertension: Secondary | ICD-10-CM | POA: Insufficient documentation

## 2022-08-22 DIAGNOSIS — E559 Vitamin D deficiency, unspecified: Secondary | ICD-10-CM | POA: Insufficient documentation

## 2022-08-22 DIAGNOSIS — E785 Hyperlipidemia, unspecified: Secondary | ICD-10-CM | POA: Insufficient documentation

## 2022-08-22 DIAGNOSIS — E063 Autoimmune thyroiditis: Secondary | ICD-10-CM | POA: Insufficient documentation

## 2022-08-22 NOTE — Patient Instructions (Addendum)
Continue the Collagen supplement as you seem to feel better taking this. Aim for about 60 grams of protein per day Continue to take you protein shake daily (or protein bar) Self care - meal prepping, balanced meals  When you are thinking about what a meal should be:  Would you serve this meal to a friend or your daughter?  A meal should contain at least 3 components  Each meal should contain a protein (1-3 oz)  Protein choices that you are able to eat: Peanut butter  Cheese Beans Nuts (serve on oatmeal and for snacks)  Consider trying  Ric[place]ta in [place] shells or lasangne.  Mix ricotta with cooked spinach (or frozen thawed and drained), garlic onions, garlic powder and other seasonings as desired. Black bean burger Other vegetarian products such as spicy "chick" patty Add nuts and rinsed chick peas to a salad along with cheese  Meal ideas: Breakfast: Oatmeal, nuts or peanut butter, fruit Yogurt and fruit  Lunch:   Salad with cheese, nuts, rinsed beans (or Charcuterie board) Bean or vegetable soup with crackers, fruit  Dinner:   Pinto beans, rice, lettuce, tomato, cheese, olives, avocado, salsa Black bean burger on a thin bun, vegetable, fruit Vegetable plate with beans, sweet potato, vegetable for example Grilled cheese sandwich and tomato soup

## 2022-08-22 NOTE — Progress Notes (Signed)
Diabetes Self-Management Education  Visit Type: First/Initial  Appt. Start Time: 0920 Appt. End Time: 1050  08/29/2022  Stephanie [name], identified by name and date of birth, is a 66 y.o. female with a diagnosis of Diabetes: Type 2.   ASSESSMENT Patient is here today alone. Nausea only when hungry since Trulucity.  She is decreasing the dose this week. Fasting glucose 165-185  Referral for Type 2 Diabetes.  Food aversions and concerns of inadequate protein intake.  Hx includes:  T2DM (2005), GDM (1988) HTN, HLD, Hashimoto's Thyroiditis, RLS, RA, vitamin D deficiency Medications include:  Jardiance, Trulicity, Metformin XR, synthroid, potassium, Omega 3, vitamin D, collagen supplement Labs noted to include:  A1C 7.2% 06/10/2022 CGM:  Libre 3 (average blood glucose 148-159 over the past 7-90 days) Sleep:  poor about 4 hours.  Restless  Weight hx:  62" 119 lbs 08/22/2022 117 lbs  06/11/2022 124 lbs when she retired (lifting weights in the gym at that time) Not afraid about gaining weight.  Patient lives alone.  Her ex-husband is in memory care. She states that she does not eat "horribly" but doesn't eat a lot of variety. She has some food aversions due to trauma.  (Bad hunting experience.) She is complaining of loss of muscle mass, scaly skin, hair loss within the last 2 years. Has had stress related balding patches throughout her life at times. Started a collagen supplement and states that she is sleeping better.  Her legs move all night but noted that she is sleeping much better since starting these collagen supplement (bone, muscle, skin, and hair formulations). Can't eat red meat and 1 1/2 years ago can no longer eat chicken, fish, or eggs. Can't drink milk but likes yogurt and cheese  Likes beans She sees a Social worker. She is retired from [employer] (heavy lifting) about 2017. 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  Stays very busy. Walks daily at 4:30  in the am with a friend and then goes back to bed.  Height '5\' 2"'$  (1.575 m), weight 119 lb (54 kg). Body mass index is 21.77 kg/m.   Diabetes Self-Management Education - 08/22/22 0951       Visit Information   Visit Type First/Initial      Initial Visit   Diabetes Type Type 2    Date Diagnosed 2005    Are you currently following a meal plan? No    Are you taking your medications as prescribed? Yes      Health Coping   How would you rate your overall health? Good      Psychosocial Assessment   Patient Belief/Attitude about Diabetes Motivated to manage diabetes    What is the hardest part about your diabetes right now, causing you the most concern, or is the most worrisome to you about your diabetes?   Making healty food and beverage choices    Self-care barriers None    Self-management support Doctor's office    Other persons present Patient    Patient Concerns Nutrition/Meal planning    Special Needs None    Preferred Learning Style No preference indicated    Learning Readiness Ready    How often do you need to have someone help you when you read instructions, pamphlets, or other written materials from your doctor or pharmacy? 1 - Never    What is the last grade level you completed in school? 12      Pre-Education Assessment   Patient understands the  diabetes disease and treatment process. Needs Instruction    Patient understands incorporating nutritional management into lifestyle. Needs Instruction    Patient undertands incorporating physical activity into lifestyle. Needs Instruction    Patient understands using medications safely. Needs Instruction    Patient understands monitoring blood glucose, interpreting and using results Needs Instruction    Patient understands prevention, detection, and treatment of acute complications. Needs Instruction    Patient understands prevention, detection, and treatment of chronic complications. Needs Instruction    Patient understands how  to develop strategies to address psychosocial issues. Needs Instruction    Patient understands how to develop strategies to promote health/change behavior. Needs Instruction      Complications   Last HgB A1C per patient/outside source 7.2 %   05/2022   How often do you check your blood sugar? > 4 times/day    Fasting Blood glucose range (mg/dL) 130-179    Number of hypoglycemic episodes per month 0    Have you had a dilated eye exam in the past 12 months? Yes    Have you had a dental exam in the past 12 months? Yes    Are you checking your feet? Yes    How many days per week are you checking your feet? 7      Dietary Intake   Breakfast cheddar cheese, olives, banana, PB OR instant maple oatmeal, banana, PB    Snack (morning) none    Lunch chicken broth with crunchy noodles OR saltine crackers with PB    Snack (afternoon) none    Dinner banana and PB OR green beans, saurcraut, cheese, fruit OR pintos, saurcraut, greenbeans or cabbage    Snack (evening) none    Beverage(s) water, Premier protein shake      Activity / Exercise   Activity / Exercise Type Light (walking / raking leaves)    How many days per week do you exercise? 5    How many minutes per day do you exercise? 40    Total minutes per week of exercise 200      Patient Education   Previous Diabetes Education No    Healthy Eating Role of diet in the treatment of diabetes and the relationship between the three main macronutrients and blood glucose level;Food label reading, portion sizes and measuring food.;Plate Method;Meal options for control of blood glucose level and chronic complications.;Other (comment)   meal planning for adequate nutrition   Being Active Role of exercise on diabetes management, blood pressure control and cardiac health.    Medications Reviewed patients medication for diabetes, action, purpose, timing of dose and side effects.    Monitoring Taught/evaluated CGM (comment)      Individualized Goals  (developed by patient)   Nutrition General guidelines for healthy choices and portions discussed    Physical Activity Exercise 3-5 times per week;30 minutes per day    Medications take my medication as prescribed    Monitoring  Consistenly use CGM    Problem Solving Eating Pattern    Reducing Risk do foot checks daily;examine blood glucose patterns      Post-Education Assessment   Patient understands the diabetes disease and treatment process. Demonstrates understanding / competency    Patient understands incorporating nutritional management into lifestyle. Comprehends key points    Patient undertands incorporating physical activity into lifestyle. Comprehends key points    Patient understands using medications safely. Comphrehends key points    Patient understands monitoring blood glucose, interpreting and using results Comprehends key points  Patient understands prevention, detection, and treatment of acute complications. Comprehends key points    Patient understands prevention, detection, and treatment of chronic complications. Comprehends key points    Patient understands how to develop strategies to address psychosocial issues. Comprehends key points    Patient understands how to develop strategies to promote health/change behavior. Comprehends key points      Outcomes   Expected Outcomes Demonstrated interest in learning. Expect positive outcomes    Future DMSE 2 months    Program Status Not Completed             Individualized Plan for Diabetes Self-Management Training:   Learning Objective:  Patient will have a greater understanding of diabetes self-management. Patient education plan is to attend individual and/or group sessions per assessed needs and concerns.   Plan:   Patient Instructions  Continue the Collagen supplement as you seem to feel better taking this. Aim for about 60 grams of protein per day Continue to take you protein shake daily (or protein  bar) Self care - meal prepping, balanced meals  When you are thinking about what a meal should be:  Would you serve this meal to a friend or your daughter?  A meal should contain at least 3 components  Each meal should contain a protein (1-3 oz)  Protein choices that you are able to eat: Peanut butter  Cheese Beans Nuts (serve on oatmeal and for snacks)  Consider trying  Ric[place]ta in [place] shells or lasangne.  Mix ricotta with cooked spinach (or frozen thawed and drained), garlic onions, garlic powder and other seasonings as desired. Black bean burger Other vegetarian products such as spicy "chick" patty Add nuts and rinsed chick peas to a salad along with cheese  Meal ideas: Breakfast: Oatmeal, nuts or peanut butter, fruit Yogurt and fruit  Lunch:   Salad with cheese, nuts, rinsed beans (or Charcuterie board) Bean or vegetable soup with crackers, fruit  Dinner:   Pinto beans, rice, lettuce, tomato, cheese, olives, avocado, salsa Black bean burger on a thin bun, vegetable, fruit Vegetable plate with beans, sweet potato, vegetable for example Grilled cheese sandwich and tomato soup  Expected Outcomes:  Demonstrated interest in learning. Expect positive outcomes  Education material provided: My Plate, Vegetarian Proteins  If problems or questions, patient to contact team via:  Phone  Future DSME appointment: 2 months

## 2022-09-03 ENCOUNTER — Ambulatory Visit: Payer: HMO | Admitting: Internal Medicine

## 2022-09-03 ENCOUNTER — Encounter: Payer: Self-pay | Admitting: Internal Medicine

## 2022-09-03 VITALS — BP 116/70 | HR 81 | Ht 62.0 in | Wt 118.5 lb

## 2022-09-03 DIAGNOSIS — K649 Unspecified hemorrhoids: Secondary | ICD-10-CM

## 2022-09-03 NOTE — Progress Notes (Signed)
PROCEDURE NOTE: The patient presents with symptomatic grade 2  hemorrhoids, requesting rubber band ligation of his/her hemorrhoidal disease.  All risks, benefits and alternative forms of therapy were described and informed consent was obtained.  Patient states that she has been having regular stools, often passing stools when going to the bathroom to urinate.  Patient was placed in the Left Lateral Decubitus position  The anorectum was pre-medicated with Recticare and nitroglycerin The decision was made to band the left anterior internal hemorrhoid, and the [place] was used to perform band ligation without complication.  Digital anorectal examination was then performed to assure proper positioning of the band, and to adjust the banded tissue as required.  The patient was discharged home without pain or other issues.  Dietary and behavioral recommendations were given and along with follow-up instructions.     The following adjunctive treatments were recommended: Continue fiber intake.  The patient will return in 4 weeks for  follow-up and possible additional banding as required. No complications were encountered and the patient tolerated the procedure well.

## 2022-09-03 NOTE — Patient Instructions (Signed)

## 2022-09-30 LAB — HM DIABETES EYE EXAM

## 2022-10-01 ENCOUNTER — Telehealth: Payer: Self-pay

## 2022-10-01 NOTE — Telephone Encounter (Signed)
error 

## 2022-10-06 ENCOUNTER — Other Ambulatory Visit: Payer: Self-pay | Admitting: Internal Medicine

## 2022-10-08 ENCOUNTER — Encounter: Payer: Self-pay | Admitting: Internal Medicine

## 2022-10-14 ENCOUNTER — Encounter: Payer: HMO | Admitting: Internal Medicine

## 2022-10-21 ENCOUNTER — Ambulatory Visit: Payer: HMO | Admitting: Dietician

## 2022-10-24 ENCOUNTER — Ambulatory Visit: Payer: HMO | Admitting: Internal Medicine

## 2022-10-24 ENCOUNTER — Encounter: Payer: Self-pay | Admitting: Internal Medicine

## 2022-10-24 VITALS — BP 98/72 | HR 96 | Ht 62.0 in | Wt 119.0 lb

## 2022-10-24 DIAGNOSIS — K649 Unspecified hemorrhoids: Secondary | ICD-10-CM

## 2022-10-24 DIAGNOSIS — K642 Third degree hemorrhoids: Secondary | ICD-10-CM

## 2022-10-24 NOTE — Patient Instructions (Signed)
_______________________________________________________  If you are age 66 or older, your body mass index should be between 23-30. Your Body mass index is 21.77 kg/m. If this is out of the aforementioned range listed, please consider follow up with your Primary Care Provider.  If you are age 4 or younger, your body mass index should be between 19-25. Your Body mass index is 21.77 kg/m. If this is out of the aformentioned range listed, please consider follow up with your Primary Care Provider.   ________________________________________________________  The [facility] providers would like to encourage you to use [facility] to communicate with providers for non-urgent requests or questions.  Due to long hold times on the telephone, sending your provider a message by [facility] may be a faster and more efficient way to get a response.  Please allow 48 business hours for a response.  Please remember that this is for non-urgent requests.   HEMORRHOID BANDING PROCEDURE    FOLLOW-UP CARE   The procedure you have had should have been relatively painless since the banding of the area involved does not have nerve endings and there is no pain sensation.  The rubber band cuts off the blood supply to the hemorrhoid and the band may fall off as soon as 48 hours after the banding (the band may occasionally be seen in the toilet bowl following a bowel movement). You may notice a temporary feeling of fullness in the rectum which should respond adequately to plain Tylenol or Motrin.  Following the banding, avoid strenuous exercise that evening and resume full activity the next day.  A sitz bath (soaking in a warm tub) or bidet is soothing, and can be useful for cleansing the area after bowel movements.     To avoid constipation, take two tablespoons of natural wheat bran, natural oat bran, flax, Benefiber or any over the counter fiber supplement and increase your water intake to 7-8 glasses daily.    Unless you  have been prescribed anorectal medication, do not put anything inside your rectum for two weeks: No suppositories, enemas, fingers, etc.  Occasionally, you may have more bleeding than usual after the banding procedure.  This is often from the untreated hemorrhoids rather than the treated one.  Don't be concerned if there is a tablespoon or so of blood.  If there is more blood than this, lie flat with your bottom higher than your head and apply an ice pack to the area. If the bleeding does not stop within a half an hour or if you feel faint, call our office at [phone] or go to the emergency room.  Problems are not common; however, if there is a substantial amount of bleeding, severe pain, chills, fever or difficulty passing urine (very rare) or other problems, you should call us at [phone] or report to the nearest emergency room.  Do not stay seated continuously for more than 2-3 hours for a day or two after the procedure.  Tighten your buttock muscles 10-15 times every two hours and take 10-15 deep breaths every 1-2 hours.  Do not spend more than a few minutes on the toilet if you cannot empty your bowel; instead re-visit the toilet at a later time.    Thank you for entrusting me with your care and choosing [facility].  Dr Lorenso Courier

## 2022-10-24 NOTE — Progress Notes (Signed)
PROCEDURE NOTE: The patient presents with symptomatic grade 3  hemorrhoids, requesting rubber band ligation of his/her hemorrhoidal disease.  All risks, benefits and alternative forms of therapy were described and informed consent was obtained.  In the Left Lateral Decubitus position the anorectum was pre-medicated with Recticare and nitroglycerin The decision was made to band the posterior internal hemorrhoid, and the [place] was used to perform band ligation without complication.  Digital anorectal examination was then performed to assure proper positioning of the band, and to adjust the banded tissue as required.  The patient was discharged home without pain or other issues.  Dietary and behavioral recommendations were given and along with follow-up instructions.     The following adjunctive treatments were recommended: Hydration, fiber intake, encouraging regular stools

## 2022-11-01 LAB — HM DIABETES EYE EXAM

## 2022-11-04 ENCOUNTER — Other Ambulatory Visit: Payer: Self-pay | Admitting: Internal Medicine

## 2022-11-04 ENCOUNTER — Encounter: Payer: Self-pay | Admitting: Internal Medicine

## 2022-11-19 DIAGNOSIS — D119 Benign neoplasm of major salivary gland, unspecified: Secondary | ICD-10-CM | POA: Diagnosis not present

## 2022-11-19 DIAGNOSIS — I10 Essential (primary) hypertension: Secondary | ICD-10-CM | POA: Diagnosis not present

## 2022-11-19 DIAGNOSIS — M069 Rheumatoid arthritis, unspecified: Secondary | ICD-10-CM | POA: Diagnosis not present

## 2022-11-19 DIAGNOSIS — F331 Major depressive disorder, recurrent, moderate: Secondary | ICD-10-CM | POA: Diagnosis not present

## 2022-11-19 DIAGNOSIS — E1169 Type 2 diabetes mellitus with other specified complication: Secondary | ICD-10-CM | POA: Diagnosis not present

## 2022-11-19 DIAGNOSIS — Z1211 Encounter for screening for malignant neoplasm of colon: Secondary | ICD-10-CM | POA: Diagnosis not present

## 2022-11-19 DIAGNOSIS — Z1231 Encounter for screening mammogram for malignant neoplasm of breast: Secondary | ICD-10-CM | POA: Diagnosis not present

## 2022-11-19 DIAGNOSIS — E039 Hypothyroidism, unspecified: Secondary | ICD-10-CM | POA: Diagnosis not present

## 2022-11-22 ENCOUNTER — Encounter: Payer: Self-pay | Admitting: Dietician

## 2022-11-22 ENCOUNTER — Encounter: Payer: PPO | Attending: Internal Medicine | Admitting: Dietician

## 2022-11-22 VITALS — Wt 119.0 lb

## 2022-11-22 DIAGNOSIS — E119 Type 2 diabetes mellitus without complications: Secondary | ICD-10-CM | POA: Diagnosis not present

## 2022-11-22 NOTE — Patient Instructions (Addendum)
Goal: Walking most mornings Continue to reduce sugar intake Aim to have a protein with each meal (nuts, seeds, yogurt, cheese, beans, egg beaters)  Dinner idea tonight: Egg on toast with avocado, bean salad, frozen brussels sprouts.

## 2022-11-22 NOTE — Progress Notes (Unsigned)
Diabetes Self-Management Education  Visit Type: Follow-up  Appt. Start Time: 1625 Appt. End Time: 9563  11/25/2022  Stephanie [name], identified by name and date of birth, is a 67 y.o. female with a diagnosis of Diabetes: Type 2.   ASSESSMENT Patient is here today alone.  She was last seen by this RD on 08/21/2022. She states that aversion to meat has improved at times.  Tolerated ham, chicken tenders.  Going to try to eat eggs when she cooks them.  Ate precooked omelets on the cruise. States that hair lost is stable and hair is more shiny.  Skin remains dry.   Feels week.  States that she is going to begin using some weights when she starts back at the gym and will walk consistently when the gym is open. Hip pain and is concerned about this related to history of RA.  Thinks A1C increased due to Christmas, Cruise over the holidays. Gym under construction and has not walked in December.  States that it may reopen soon. States that she needs to reduce her sugar intake and is working on this since 11/18/22.  Referral for Type 2 Diabetes.  Food aversions and concerns of inadequate protein intake.   Hx includes:  T2DM (2005), GDM (1988) HTN, HLD, Hashimoto's Thyroiditis, RLS, RA, vitamin D deficiency Medications include:  Jardiance, Trulicity, Metformin XR, synthroid, potassium, Omega 3, vitamin D, collagen supplement Labs noted to include:  A1C 8.1% 11/19/22 (per patient) increased from 7.2% 06/10/2022 CGM:  Libre 3 127, 196 fasting Sleep:  improved after starting collagen supplement   Weight hx:  62" 119 lbs 11/22/2022 119 lbs 08/22/2022 117 lbs  06/11/2022 124 lbs when she retired (lifting weights in the gym at that time) Not afraid about gaining weight.   Patient lives alone.  Her ex-husband is in memory care. She states that she does not eat "horribly" but doesn't eat a lot of variety. She has some food aversions due to trauma.  (Bad hunting experience.) She is complaining of loss of  muscle mass, scaly skin, hair loss within the last 2 years. Has had stress related balding patches throughout her life at times. Started a collagen supplement and states that she is sleeping better.  Her legs move all night but noted that she is sleeping much better since starting these collagen supplement (bone, muscle, skin, and hair formulations). Can't eat red meat and 1 1/2 years ago can no longer eat chicken, fish, or eggs due to food aversion. Can't drink milk but likes yogurt and cheese   Likes beans She sees a Social worker. She is retired from [employer] (heavy lifting) about 2017. 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  Stays very busy. Walks daily at 4:30 in the am with a friend and then goes back to bed but hasn't been walking recently There were no vitals taken for this visit. There is no height or weight on file to calculate BMI.   Diabetes Self-Management Education - 11/25/22 1426       Visit Information   Visit Type Follow-up      Initial Visit   Diabetes Type Type 2    Are you taking your medications as prescribed? Yes      Psychosocial Assessment   Patient Belief/Attitude about Diabetes Motivated to manage diabetes    What is the hardest part about your diabetes right now, causing you the most concern, or is the most worrisome to you about your diabetes?  Making healty food and beverage choices    Self-care barriers None    Self-management support Doctor's office;CDE visits    Other persons present Patient    Patient Concerns Nutrition/Meal planning    Special Needs None    Preferred Learning Style No preference indicated    Learning Readiness Ready      Pre-Education Assessment   Patient understands the diabetes disease and treatment process. Demonstrates understanding / competency    Patient understands incorporating nutritional management into lifestyle. Comprehends key points    Patient undertands incorporating physical activity into  lifestyle. Comprehends key points    Patient understands using medications safely. Comprehends key points    Patient understands monitoring blood glucose, interpreting and using results Comprehends key points    Patient understands prevention, detection, and treatment of acute complications. Comprehends key points    Patient understands prevention, detection, and treatment of chronic complications. Compreheands key points    Patient understands how to develop strategies to address psychosocial issues. Comprehends key points    Patient understands how to develop strategies to promote health/change behavior. Comprehends key points      Complications   Last HgB A1C per patient/outside source 8.1 %   11/19/22 increased from 7.2% 06/10/2022   How often do you check your blood sugar? > 4 times/day    Fasting Blood glucose range (mg/dL) 70-129;130-179;180-200      Dietary Intake   Breakfast oatmeal, blueberries    Lunch leftover blackeye peas, collards, sauerkraut, orange    Dinner 3 bean salad, pinto beans    Snack (evening) occasional cheese, nuts, olives, sweet vinegar slaw    Beverage(s) water. Premier protein (1 most days), zero Dr. Malachi Bonds      Activity / Exercise   Activity / Exercise Type ADL's      Patient Education   Previous Diabetes Education Yes (please comment)   08/2022   Healthy Eating Meal options for control of blood glucose level and chronic complications.    Medications Reviewed patients medication for diabetes, action, purpose, timing of dose and side effects.    Monitoring Taught/evaluated CGM (comment)    Diabetes Stress and Support Worked with patient to identify barriers to care and solutions;Identified and addressed patients feelings and concerns about diabetes      Individualized Goals (developed by patient)   Nutrition General guidelines for healthy choices and portions discussed    Physical Activity Exercise 5-7 days per week;30 minutes per day    Medications take  my medication as prescribed    Monitoring  Consistenly use CGM    Problem Solving Eating Pattern    Reducing Risk treat hypoglycemia with 15 grams of carbs if blood glucose less than '70mg'$ /dL;do foot checks daily;examine blood glucose patterns      Patient Self-Evaluation of Goals - Patient rates self as meeting previously set goals (% of time)   Nutrition 25 - 50% (sometimes)    Physical Activity < 25% (hardly ever/never)    Medications >75% (most of the time)    Monitoring >75% (most of the time)    Problem Solving and behavior change strategies  25 - 50% (sometimes)    Reducing Risk (treating acute and chronic complications) 25 - 78% (sometimes)    Health Coping 50 - 75 % (half of the time)      Post-Education Assessment   Patient understands the diabetes disease and treatment process. Demonstrates understanding / competency    Patient understands incorporating nutritional management into lifestyle. Comprehends  key points    Patient undertands incorporating physical activity into lifestyle. Comprehends key points    Patient understands using medications safely. Comphrehends key points    Patient understands monitoring blood glucose, interpreting and using results Comprehends key points    Patient understands prevention, detection, and treatment of acute complications. Comprehends key points    Patient understands prevention, detection, and treatment of chronic complications. Comprehends key points    Patient understands how to develop strategies to address psychosocial issues. Comprehends key points    Patient understands how to develop strategies to promote health/change behavior. Needs Review      Outcomes   Expected Outcomes Demonstrated interest in learning. Expect positive outcomes    Future DMSE 2 months    Program Status Not Completed      Subsequent Visit   Since your last visit have you experienced any weight changes? Gain    Weight Gain (lbs) 2              Individualized Plan for Diabetes Self-Management Training:   Learning Objective:  Patient will have a greater understanding of diabetes self-management. Patient education plan is to attend individual and/or group sessions per assessed needs and concerns.   Plan:   Patient Instructions  Goal: Walking most mornings Continue to reduce sugar intake Aim to have a protein with each meal (nuts, seeds, yogurt, cheese, beans, egg beaters)  Dinner idea tonight: Egg on toast with avocado, bean salad, frozen brussels sprouts.  Expected Outcomes:  Demonstrated interest in learning. Expect positive outcomes  Education material provided:   If problems or questions, patient to contact team via:  Phone  Future DSME appointment: 2 months

## 2022-12-09 ENCOUNTER — Encounter: Payer: Self-pay | Admitting: Internal Medicine

## 2022-12-09 ENCOUNTER — Ambulatory Visit (INDEPENDENT_AMBULATORY_CARE_PROVIDER_SITE_OTHER): Payer: PPO | Admitting: Internal Medicine

## 2022-12-09 VITALS — BP 125/86 | HR 74 | Ht 62.0 in | Wt 118.4 lb

## 2022-12-09 DIAGNOSIS — E119 Type 2 diabetes mellitus without complications: Secondary | ICD-10-CM

## 2022-12-09 DIAGNOSIS — E785 Hyperlipidemia, unspecified: Secondary | ICD-10-CM

## 2022-12-09 DIAGNOSIS — E063 Autoimmune thyroiditis: Secondary | ICD-10-CM | POA: Diagnosis not present

## 2022-12-09 LAB — POCT GLYCOSYLATED HEMOGLOBIN (HGB A1C): Hemoglobin A1C: 7.5 % — AB (ref 4.0–5.6)

## 2022-12-09 MED ORDER — TRULICITY 3 MG/0.5ML ~~LOC~~ SOAJ: 3.0000 mg | SUBCUTANEOUS | 3 refills | Status: DC

## 2022-12-09 MED ORDER — EMPAGLIFLOZIN 25 MG PO TABS: 25.0000 mg | ORAL_TABLET | Freq: Every day | ORAL | 3 refills | Status: DC

## 2022-12-09 MED ORDER — ATORVASTATIN CALCIUM 10 MG PO TABS: 10.0000 mg | ORAL_TABLET | Freq: Every day | ORAL | 3 refills | Status: DC

## 2022-12-09 MED ORDER — METFORMIN HCL ER 500 MG PO TB24
1000.0000 mg | ORAL_TABLET | Freq: Two times a day (BID) | ORAL | 3 refills | Status: DC
Start: 2022-12-09 — End: 2024-01-05

## 2022-12-09 MED ORDER — SYNTHROID 75 MCG PO TABS: 75.0000 ug | ORAL_TABLET | Freq: Every day | ORAL | 3 refills | Status: DC

## 2022-12-09 NOTE — Progress Notes (Signed)
Name: Stephanie [name]  MRN/ DOB: [id number], 27-Nov-1955   Age/ Sex: 67 y.o., female    PCP: Celene Squibb, MD   Reason for Endocrinology Evaluation: Type 2 Diabetes Mellitus/ Hypothyroidism     Date of Initial Endocrinology Visit: 08/06/2021    PATIENT IDENTIFIER: Stephanie [name] is a 67 y.o. female with a past medical history of T2DM, HTN, Hashimoto's Thyroiditis , RLS and RA . The patient presented for initial endocrinology clinic visit on 08/06/2021 for consultative assistance with her diabetes management.    HPI: Ms. Ohagan was    Diagnosed with gestational DM in 1988, was diagnosed with T2DM in 2005, she was started on Metformin at the time. GAD-65 negative in 2016 Prior Medications tried/Intolerance: Glipizide, Glyxambi          Hemoglobin A1c has ranged from 6.1 %in 2015, peaking at 7.1% in 2022.   Follow with Rheumatology Dr. Trudie Reed for RA . On Plaquanil   Will reduce Trulicity 0/2637 due to loose stools, she has chronic nausea   THYROID HISTORY: She has been diagnosed with Hashimoto's Thyroiditis . Synthroid started in 2016  Not on Biotin      SUBJECTIVE:   During the last visit (06/10/2022): A1c 7.2%    Today (12/09/22): Stephanie [name]  is here for a follow up on diabetes management and hypothyroidism. She checks her blood sugars multiple  times daily, through CGM. The patient has no had hypoglycemic episodes since the last clinic visit.  She met with our CDE on 11/22/2022 She had a follow-up with GI 10/24/2022 for rubber band ligation due to hemorrhoids    She has chronic nausea but this has improved with reducing Trulicity  She has chronic loose stools has improved tremendously  Sees Rheumatology, has right hip pain   She has food aversions, sees a therapist    HOME ENDOCRINE REGIMEN: Metformin 500 mg XR 2 tabs in the morning and 2 tablet at night  Trulicity 3 mg weekly  Jardiance 25 mg daily  Synthroid 75 mcg daily  Atorvastatin 10 mg daily        Statin: yes ACE-I/ARB: yes     CONTINUOUS GLUCOSE MONITORING RECORD INTERPRETATION    Dates of Recording:1/8-1/21/2024  Sensor description: Freestyle libre   Results statistics:   CGM use % of time 69  Average and SD 166/19.5  Time in range  73%  % Time Above 180 25  % Time above 250 2  % Time Below target 0     Glycemic patterns summary: BG is optimal at night with postprandial hyperglycemia noted during the day  Hyperglycemic episodes postprandial  Hypoglycemic episodes occurred N/A  Overnight periods: Optimal      DIABETIC COMPLICATIONS: Microvascular complications:   Denies: retinopathy, neuropathy, CKD  Last eye exam: Completed 11/01/2022-  Dr. Herbert Deaner   Macrovascular complications:   Denies: CAD, PVD, CVA   PAST HISTORY: Past Medical History:  Past Medical History:  Diagnosis Date   Diabetes ([place])    Dyslipidemia    GERD (gastroesophageal reflux disease)    Hashimoto's thyroiditis    HLD (hyperlipidemia)    Hypertension    Hypothyroidism    Kidney stones    Pain in joint, ankle and foot 03/30/2015   RA (rheumatoid arthritis) ([place])    Vitamin D deficiency    Past Surgical History:  Past Surgical History:  Procedure Laterality Date   CARPAL TUNNEL RELEASE Bilateral    CHOLECYSTECTOMY     EXPLORATORY LAPAROTOMY  several for infertility   HEMORRHOID SURGERY     INVITRO SURGERY     GIFT   TONSILLECTOMY      Social History:  reports that she quit smoking about 31 years ago. Her smoking use included cigarettes. She has never used smokeless tobacco. She reports that she does not drink alcohol. No history on file for drug use. Family History:  Family History  Problem Relation Age of Onset   Rheum arthritis Mother    Heart failure Mother    Heart disease Father    Heart attack Father 65   Breast cancer Sister        32s   Diabetes Brother    Diabetes Brother    Kidney cancer Maternal Grandfather    Rheum arthritis  Daughter    Hashimoto's thyroiditis Daughter      HOME MEDICATIONS: Allergies as of 12/09/2022   No Known Allergies      Medication List        Accurate as of December 09, 2022  9:01 AM. If you have any questions, ask your nurse or doctor.          atorvastatin 10 MG tablet Commonly known as: LIPITOR Take 1 tablet (10 mg total) by mouth daily.   cycloSPORINE 0.05 % ophthalmic emulsion Commonly known as: RESTASIS 1 drop 2 (two) times daily. Instill 1 drop into both eyes twice a day.   Fish Oil 1200 MG Caps Take by mouth 2 (two) times daily.   FreeStyle Libre 14 Day Sensor Misc 1 Device by Other route every 14 (fourteen) days. Use as instructed to check blood sugar. Change every 14 days.   Humira (2 Pen) 40 MG/0.4ML Pnkt Generic drug: Adalimumab every 14 (fourteen) days.   hydrocortisone 2.5 % rectal cream Commonly known as: ANUSOL-HC Place 1 Application rectally 2 (two) times daily. For 7 days   hydroxychloroquine 200 MG tablet Commonly known as: PLAQUENIL Take 200 mg by mouth 2 (two) times daily. Pt is only taking 1 a day   Jardiance 25 MG Tabs tablet Generic drug: empagliflozin TAKE 1 TABLET (25 MG TOTAL) BY MOUTH DAILY.   lisinopril-hydrochlorothiazide 20-25 MG tablet Commonly known as: ZESTORETIC Take 1 tablet by mouth as directed. Takes 1/2 tablet daily.   meloxicam 15 MG tablet Commonly known as: MOBIC Take 1 tablet by mouth daily.   metFORMIN 500 MG 24 hr tablet Commonly known as: GLUCOPHAGE-XR Take 2 tablets (1,000 mg total) by mouth in the morning and at bedtime.   NEXIUM PO Take by mouth daily.   potassium chloride 10 MEQ CR capsule Commonly known as: MICRO-K Take by mouth daily.   sertraline 25 MG tablet Commonly known as: ZOLOFT daily.   Synthroid 75 MCG tablet Generic drug: levothyroxine Take 1 tablet (75 mcg total) by mouth daily before breakfast.   Trulicity 3 QQ/5.9DG Sopn Generic drug: Dulaglutide Inject 3 mg as directed  once a week.   valACYclovir 500 MG tablet Commonly known as: VALTREX Take 1 tablet by mouth daily.   Vitamin D-3 125 MCG (5000 UT) Tabs Take by mouth 3 (three) times a week.         ALLERGIES: No Known Allergies      OBJECTIVE:   VITAL SIGNS: BP 125/86 (BP Location: Right Arm, Patient Position: Sitting, Cuff Size: Normal)   Pulse 74   Ht '5\' 2"'$  (1.575 m)   Wt 118 lb 6.4 oz (53.7 kg)   SpO2 97%   BMI 21.66 kg/m  PHYSICAL EXAM:  General: Pt appears well and is in NAD  Neck: General: Supple without adenopathy or carotid bruits. Thyroid: Thyroid size normal.  No goiter or nodules appreciated.   Lungs: Clear with good BS bilat with no rales, rhonchi, or wheezes  Heart: RRR   Abdomen: soft, nontender, without masses or organomegaly palpable  Extremities:  Lower extremities - No pretibial edema.   Neuro: MS is good with appropriate affect, pt is alert and Ox3    DM foot exam: 12/09/2022  The skin of the feet is intact without sores or ulcerations. The pedal pulses are 2+ on right and 2+ on left. The sensation is intact to a screening 5.07, 10 gram monofilament bilaterally   DATA REVIEWED:  Lab Results  Component Value Date   HGBA1C 7.5 (A) 12/09/2022   HGBA1C 7.2 (A) 06/10/2022   HGBA1C 7.5 (A) 12/10/2021   11/19/2022 A1c 8.1% HDL 58 LDL 44 MA/Cr normal    08/20/2022 BUN 21 Cr 0.740 GFR 86   ASSESSMENT / PLAN / RECOMMENDATIONS:   1) Type 2 Diabetes Mellitus, Optimally controlled, Without complications - Most recent A1c of 7.5 %. Goal A1c < 7.0 %.    - A1c trending up, pt attributes this to holidays dietary indiscretions  - Intolerant to higher doses of Trulicity  - We discussed adding Glimepiride  but she would like to work on lifestyle changes at this time    MEDICATIONS: Continue metformin 500 mg 2 tablets with Breakfast and 2 tablets with Supper Continue Jardiance 25 mg, 1 tablet in the morning  Continue Trulicity 3.0  mg weekly   EDUCATION  / INSTRUCTIONS: BG monitoring instructions: Patient is instructed to check her blood sugars 3 times a day, before meals . Call [place] [facility] if: BG persistently < 70  I reviewed the Rule of 15 for the treatment of hypoglycemia in detail with the patient. Literature supplied.   2) Diabetic complications:  Eye: Does not have known diabetic retinopathy.  Neuro/ Feet: Does not have known diabetic peripheral neuropathy. Renal: Patient does not have known baseline CKD. She is  not on an ACEI/ARB at present.  3) Hashimoto's Thyroiditis   -Patient is clinically euthyroid -TSH has been normal in the past   Medication  Continue Synthroid 75 mcg   4) Dyslipidemia:  -LDL and TG at goal, continue atorvastatin   Medication  Atorvastatin 10 mg daily    Follow-up in 4 months    Signed electronically by: Mack Guise, MD  [facility] Endocrinology  [facility] [address] [place], [place] [address] Phone: [phone] FAX: [phone]   CC: Celene Squibb, MD [place] [address] Phone: [phone]  Fax: [phone]    Return to Endocrinology clinic as below: Future Appointments  Date Time Provider [place]  02/20/2023  9:00 AM Valora Piccolo, Barnabas Lister, RD [place] NDM

## 2022-12-09 NOTE — Patient Instructions (Signed)
-  Continue Metformin 500 mg 2 tablets with Breakfast and 2 tablets with Supper - Continue Jardiance 25 mg, 1 tablet in the morning  - Continue Trulicity 3 mg weekly      HOW TO TREAT LOW BLOOD SUGARS (Blood sugar LESS THAN 70 MG/DL) Please follow the RULE OF 15 for the treatment of hypoglycemia treatment (when your (blood sugars are less than 70 mg/dL)   STEP 1: Take 15 grams of carbohydrates when your blood sugar is low, which includes:  3-4 GLUCOSE TABS  OR 3-4 OZ OF JUICE OR REGULAR SODA OR ONE TUBE OF GLUCOSE GEL    STEP 2: RECHECK blood sugar in 15 MINUTES STEP 3: If your blood sugar is still low at the 15 minute recheck --> then, go back to STEP 1 and treat AGAIN with another 15 grams of carbohydrates.

## 2022-12-10 ENCOUNTER — Encounter: Payer: Self-pay | Admitting: Internal Medicine

## 2022-12-31 DIAGNOSIS — R519 Headache, unspecified: Secondary | ICD-10-CM | POA: Diagnosis not present

## 2022-12-31 DIAGNOSIS — U071 COVID-19: Secondary | ICD-10-CM | POA: Diagnosis not present

## 2023-01-09 ENCOUNTER — Other Ambulatory Visit: Payer: Self-pay | Admitting: Internal Medicine

## 2023-01-09 DIAGNOSIS — E119 Type 2 diabetes mellitus without complications: Secondary | ICD-10-CM

## 2023-02-07 ENCOUNTER — Other Ambulatory Visit: Payer: Self-pay | Admitting: Internal Medicine

## 2023-02-07 DIAGNOSIS — Z1231 Encounter for screening mammogram for malignant neoplasm of breast: Secondary | ICD-10-CM

## 2023-02-17 ENCOUNTER — Other Ambulatory Visit (HOSPITAL_COMMUNITY): Payer: Self-pay

## 2023-02-17 DIAGNOSIS — E2839 Other primary ovarian failure: Secondary | ICD-10-CM | POA: Diagnosis not present

## 2023-02-17 DIAGNOSIS — Z0000 Encounter for general adult medical examination without abnormal findings: Secondary | ICD-10-CM | POA: Diagnosis not present

## 2023-02-17 DIAGNOSIS — Z1331 Encounter for screening for depression: Secondary | ICD-10-CM | POA: Diagnosis not present

## 2023-02-17 DIAGNOSIS — D649 Anemia, unspecified: Secondary | ICD-10-CM | POA: Diagnosis not present

## 2023-02-17 DIAGNOSIS — I10 Essential (primary) hypertension: Secondary | ICD-10-CM | POA: Diagnosis not present

## 2023-02-17 DIAGNOSIS — E1165 Type 2 diabetes mellitus with hyperglycemia: Secondary | ICD-10-CM | POA: Diagnosis not present

## 2023-02-17 DIAGNOSIS — Z23 Encounter for immunization: Secondary | ICD-10-CM | POA: Diagnosis not present

## 2023-02-17 DIAGNOSIS — E1169 Type 2 diabetes mellitus with other specified complication: Secondary | ICD-10-CM | POA: Diagnosis not present

## 2023-02-17 DIAGNOSIS — Z79620 Long term (current) use of immunosuppressive biologic: Secondary | ICD-10-CM | POA: Diagnosis not present

## 2023-02-17 DIAGNOSIS — M069 Rheumatoid arthritis, unspecified: Secondary | ICD-10-CM | POA: Diagnosis not present

## 2023-02-17 DIAGNOSIS — D84821 Immunodeficiency due to drugs: Secondary | ICD-10-CM | POA: Diagnosis not present

## 2023-02-17 DIAGNOSIS — F331 Major depressive disorder, recurrent, moderate: Secondary | ICD-10-CM | POA: Diagnosis not present

## 2023-02-17 DIAGNOSIS — Z0001 Encounter for general adult medical examination with abnormal findings: Secondary | ICD-10-CM | POA: Diagnosis not present

## 2023-02-19 DIAGNOSIS — Z6821 Body mass index (BMI) 21.0-21.9, adult: Secondary | ICD-10-CM | POA: Diagnosis not present

## 2023-02-19 DIAGNOSIS — M1991 Primary osteoarthritis, unspecified site: Secondary | ICD-10-CM | POA: Diagnosis not present

## 2023-02-19 DIAGNOSIS — M0600 Rheumatoid arthritis without rheumatoid factor, unspecified site: Secondary | ICD-10-CM | POA: Diagnosis not present

## 2023-02-19 DIAGNOSIS — R5383 Other fatigue: Secondary | ICD-10-CM | POA: Diagnosis not present

## 2023-02-19 DIAGNOSIS — Z79899 Other long term (current) drug therapy: Secondary | ICD-10-CM | POA: Diagnosis not present

## 2023-02-20 ENCOUNTER — Ambulatory Visit: Payer: PPO | Admitting: Dietician

## 2023-03-03 ENCOUNTER — Other Ambulatory Visit: Payer: Self-pay

## 2023-03-03 MED ORDER — TRULICITY 3 MG/0.5ML ~~LOC~~ SOAJ: 3.0000 mg | SUBCUTANEOUS | 3 refills | Status: DC

## 2023-03-11 ENCOUNTER — Telehealth: Payer: Self-pay

## 2023-03-11 NOTE — Telephone Encounter (Signed)
Left vm for patient to contact pharmacy and find out what they have in stock and call back and provide that information. Provider will decide what to change to

## 2023-03-12 ENCOUNTER — Telehealth: Payer: Self-pay

## 2023-03-12 MED ORDER — SEMAGLUTIDE (1 MG/DOSE) 4 MG/3ML ~~LOC~~ SOPN: 1.0000 mg | PEN_INJECTOR | SUBCUTANEOUS | 3 refills | Status: DC

## 2023-03-12 NOTE — Telephone Encounter (Signed)
Patient unable to get Trulicity because it's on back order. Pharmacy does have Ozempic .

## 2023-03-12 NOTE — Telephone Encounter (Signed)
Patient notified

## 2023-03-14 ENCOUNTER — Telehealth: Payer: Self-pay

## 2023-03-14 NOTE — Telephone Encounter (Signed)
Patient needs a PA on Ozempic as she is out of medication

## 2023-03-17 ENCOUNTER — Other Ambulatory Visit (HOSPITAL_COMMUNITY): Payer: Self-pay

## 2023-03-18 NOTE — Telephone Encounter (Signed)
Patient states that she has been getting medication from [facility] a little over a month ago. She was on several doses because they couldn't get certain doses.

## 2023-03-26 ENCOUNTER — Ambulatory Visit
Admission: RE | Admit: 2023-03-26 | Discharge: 2023-03-26 | Disposition: A | Discharge status: Home / Self Care | Payer: PPO | Source: Ambulatory Visit | Attending: Internal Medicine | Admitting: Internal Medicine

## 2023-03-26 DIAGNOSIS — Z1231 Encounter for screening mammogram for malignant neoplasm of breast: Secondary | ICD-10-CM | POA: Diagnosis not present

## 2023-03-27 NOTE — Telephone Encounter (Signed)
Patient is calling to say that she was supposed to be switched from Trulicity to [place], but she has been unable to get it due to insurance not covering it.  Patient has been out of Trulicity for 3 weeks and her blood sugar has been 200+ since then.

## 2023-03-28 ENCOUNTER — Telehealth: Payer: Self-pay

## 2023-03-28 ENCOUNTER — Other Ambulatory Visit (HOSPITAL_COMMUNITY): Payer: Self-pay

## 2023-03-28 NOTE — Telephone Encounter (Signed)
Can we do this PA for Ozempic

## 2023-03-28 NOTE — Telephone Encounter (Signed)
Patient Advocate Encounter   Received notification from pt msgs that prior authorization is required for Ozempic  Submitted: 03/28/23 Key [id number]  Sent for expedited review Status is pending

## 2023-03-28 NOTE — Telephone Encounter (Signed)
Pt contacted and advised She is already on maximum dose for metformin and Jardiance, will need to discuss alternative therapy, will wait till Monday. In the meantime she needs to reduce her carbohydrates even more and avoid over-the-counter sugar sweetened beverages or sweets as well as snacks.

## 2023-03-28 NOTE — Telephone Encounter (Signed)
Patient has appointment on Monday and states that her fasting blood sugar have been over 200 every morning. PA team is working on getting the [employer] approved today but she would like to know if she needs to adjust any of her current medication until Monday. She taking Metformin  1000  2 tab BID and Jardiance 25mg  1 tab daily

## 2023-03-31 ENCOUNTER — Encounter: Payer: Self-pay | Admitting: Internal Medicine

## 2023-03-31 ENCOUNTER — Ambulatory Visit (INDEPENDENT_AMBULATORY_CARE_PROVIDER_SITE_OTHER): Payer: PPO | Admitting: Internal Medicine

## 2023-03-31 VITALS — BP 112/70 | HR 80 | Ht 62.0 in | Wt 115.0 lb

## 2023-03-31 DIAGNOSIS — E063 Autoimmune thyroiditis: Secondary | ICD-10-CM | POA: Diagnosis not present

## 2023-03-31 DIAGNOSIS — E119 Type 2 diabetes mellitus without complications: Secondary | ICD-10-CM | POA: Diagnosis not present

## 2023-03-31 DIAGNOSIS — E785 Hyperlipidemia, unspecified: Secondary | ICD-10-CM

## 2023-03-31 DIAGNOSIS — Z7984 Long term (current) use of oral hypoglycemic drugs: Secondary | ICD-10-CM | POA: Diagnosis not present

## 2023-03-31 DIAGNOSIS — H401131 Primary open-angle glaucoma, bilateral, mild stage: Secondary | ICD-10-CM | POA: Diagnosis not present

## 2023-03-31 LAB — POCT GLYCOSYLATED HEMOGLOBIN (HGB A1C): Hemoglobin A1C: 7.5 % — AB (ref 4.0–5.6)

## 2023-03-31 LAB — TSH: TSH: 0.48 u[IU]/mL (ref 0.35–5.50)

## 2023-03-31 MED ORDER — GLIPIZIDE 5 MG PO TABS
5.0000 mg | ORAL_TABLET | Freq: Every day | ORAL | 3 refills | Status: DC
Start: 2023-03-31 — End: 2023-06-25

## 2023-03-31 MED ORDER — SEMAGLUTIDE (1 MG/DOSE) 4 MG/3ML ~~LOC~~ SOPN: 1.0000 mg | PEN_INJECTOR | SUBCUTANEOUS | 3 refills | Status: DC

## 2023-03-31 NOTE — Patient Instructions (Signed)
-   Start Glipizide 5 mg , 1 tablet before Breakfast  - Continue Metformin 500 mg 2 tablets with Breakfast and 2 tablets with Supper - Continue Jardiance 25 mg, 1 tablet in the morning       HOW TO TREAT LOW BLOOD SUGARS (Blood sugar LESS THAN 70 MG/DL) Please follow the RULE OF 15 for the treatment of hypoglycemia treatment (when your (blood sugars are less than 70 mg/dL)   STEP 1: Take 15 grams of carbohydrates when your blood sugar is low, which includes:  3-4 GLUCOSE TABS  OR 3-4 OZ OF JUICE OR REGULAR SODA OR ONE TUBE OF GLUCOSE GEL    STEP 2: RECHECK blood sugar in 15 MINUTES STEP 3: If your blood sugar is still low at the 15 minute recheck --> then, go back to STEP 1 and treat AGAIN with another 15 grams of carbohydrates.

## 2023-03-31 NOTE — Progress Notes (Unsigned)
Name: Stephanie [name]  MRN/ DOB: [id number], [birth year]   Age/ Sex: 67 y.o., female    PCP: Stephanie Ishihara, MD   Reason for Endocrinology Evaluation: Type 2 Diabetes Mellitus/ Hypothyroidism     Date of Initial Endocrinology Visit: 08/06/2021    PATIENT IDENTIFIER: Stephanie [name] is a 67 y.o. female with a past medical history of T2DM, HTN, Hashimoto's Thyroiditis , RLS and RA . The patient presented for initial endocrinology clinic visit on 08/06/2021 for consultative assistance with her diabetes management.    HPI: Stephanie [name] was    Diagnosed with gestational DM in 1988, was diagnosed with T2DM in 2005, she was started on Metformin at the time. GAD-65 negative in 2016 Prior Medications tried/Intolerance: Glipizide, Glyxambi          Hemoglobin A1c has ranged from 6.1 %in 2015, peaking at 7.1% in 2022.   Follow with Rheumatology Dr. Nickola Major for RA . On Plaquanil   We reduce Trulicity 05/2022 due to loose stools, she has chronic nausea which have improved with a smaller dose of Trulicity   THYROID HISTORY: She has been diagnosed with Hashimoto's Thyroiditis . Synthroid started in 2016  Not on Biotin      SUBJECTIVE:   During the last visit (12/09/2022): A1c 7.5%    Today (03/31/23): Stephanie [name]  is here for a follow up on diabetes management and hypothyroidism. She checks her blood sugars multiple  times daily, through CGM. The patient has no had hypoglycemic episodes since the last clinic visit.   Patient has been noted with hyperglycemia as she has been out of GLP-1 agonist, Trulicity short on supplies and Ozempic took a while to approve with her insurance, but she is in the donut hole and was going to be charged $240 for 2 pens 0.5 mg dose (they did not have 1 mg dose at the pharmacy)   She met with our CDE on 11/22/2022  She has food aversions, sees a therapist -this is improving   She is in the donut hole , and paid $300 for Jardiance last week    She was seen by her PCP last month, labs reviewed, she was referred to cardiology for being high risk, but the patient postpone this until she speaks to her PCP first   HOME ENDOCRINE REGIMEN: Metformin 500 mg XR 2 tabs in the morning and 2 tablet at night  Jardiance 25 mg daily  Synthroid 75 mcg daily  Atorvastatin 10 mg daily       Statin: yes ACE-I/ARB: yes     CONTINUOUS GLUCOSE MONITORING RECORD INTERPRETATION    Dates of Recording:4/30-5/13/2024  Sensor description: Freestyle libre   Results statistics:   CGM use % of time 71  Average and SD 204/29.1  Time in range 43%  % Time Above 180 39  % Time above 250 18  % Time Below target 0     Glycemic patterns summary: BG is optimal at night and fluctuating during the day    Hyperglycemic episodes postprandial  Hypoglycemic episodes occurred N/A  Overnight periods: Optimal    DIABETIC COMPLICATIONS: Microvascular complications:   Denies: retinopathy, neuropathy, CKD  Last eye exam: Completed 11/01/2022-  Dr. Elmer Picker   Macrovascular complications:   Denies: CAD, PVD, CVA   PAST HISTORY: Past Medical History:  Past Medical History:  Diagnosis Date   Diabetes ([facility])    Dyslipidemia    GERD (gastroesophageal reflux disease)    Hashimoto's thyroiditis    HLD (  hyperlipidemia)    Hypertension    Hypothyroidism    Kidney stones    Pain in joint, ankle and foot 03/30/2015   RA (rheumatoid arthritis) ([facility])    Vitamin D deficiency    Past Surgical History:  Past Surgical History:  Procedure Laterality Date   CARPAL TUNNEL RELEASE Bilateral    CHOLECYSTECTOMY     EXPLORATORY LAPAROTOMY     several for infertility   HEMORRHOID SURGERY     INVITRO SURGERY     GIFT   TONSILLECTOMY      Social History:  reports that she quit smoking about 31 years ago. Her smoking use included cigarettes. She has never used smokeless tobacco. She reports that she does not drink alcohol. No history on file for  drug use. Family History:  Family History  Problem Relation Age of Onset   Rheum arthritis Mother    Heart failure Mother    Heart disease Father    Heart attack Father 48   Breast cancer Sister        70s   Diabetes Brother    Diabetes Brother    Kidney cancer Maternal Grandfather    Rheum arthritis Daughter    Hashimoto's thyroiditis Daughter      HOME MEDICATIONS: Allergies as of 03/31/2023   No Known Allergies      Medication List        Accurate as of Mar 31, 2023  2:41 PM. If you have any questions, ask your nurse or doctor.          atorvastatin 10 MG tablet Commonly known as: LIPITOR Take 1 tablet (10 mg total) by mouth daily.   cycloSPORINE 0.05 % ophthalmic emulsion Commonly known as: RESTASIS 1 drop 2 (two) times daily. Instill 1 drop into both eyes twice a day.   empagliflozin 25 MG Tabs tablet Commonly known as: Jardiance Take 1 tablet (25 mg total) by mouth daily.   Fish Oil 1200 MG Caps Take by mouth 2 (two) times daily.   FreeStyle Libre 14 Day Sensor Misc 1 DEVICE BY OTHER ROUTE EVERY 14 (FOURTEEN) DAYS. USE AS INSTRUCTED TO CHECK BLOOD SUGAR. CHANGE EVERY 14 DAYS.   glipiZIDE 5 MG tablet Commonly known as: GLUCOTROL Take 1 tablet (5 mg total) by mouth daily before breakfast. Started by: Scarlette Shorts, MD   Humira (2 Pen) 40 MG/0.4ML Pnkt Generic drug: Adalimumab every 14 (fourteen) days.   hydrocortisone 2.5 % rectal cream Commonly known as: ANUSOL-HC Place 1 Application rectally 2 (two) times daily. For 7 days   hydroxychloroquine 200 MG tablet Commonly known as: PLAQUENIL Take 200 mg by mouth 2 (two) times daily. Pt is only taking 1 a day   lisinopril-hydrochlorothiazide 20-25 MG tablet Commonly known as: ZESTORETIC Take 1 tablet by mouth as directed. Takes 1/2 tablet daily.   meloxicam 15 MG tablet Commonly known as: MOBIC Take 1 tablet by mouth daily.   metFORMIN 500 MG 24 hr tablet Commonly known as:  GLUCOPHAGE-XR Take 2 tablets (1,000 mg total) by mouth in the morning and at bedtime.   NEXIUM PO Take by mouth daily.   potassium chloride 10 MEQ CR capsule Commonly known as: MICRO-K Take by mouth daily.   Semaglutide (1 MG/DOSE) 4 MG/3ML Sopn Inject 1 mg as directed once a week.   sertraline 25 MG tablet Commonly known as: ZOLOFT daily.   Synthroid 75 MCG tablet Generic drug: levothyroxine Take 1 tablet (75 mcg total) by mouth daily before breakfast.  Trulicity 3 MG/0.5ML Sopn Generic drug: Dulaglutide Inject 3 mg as directed once a week.   valACYclovir 500 MG tablet Commonly known as: VALTREX Take 1 tablet by mouth daily.   Vitamin D-3 125 MCG (5000 UT) Tabs Take by mouth 3 (three) times a week.         ALLERGIES: No Known Allergies      OBJECTIVE:   VITAL SIGNS: BP 112/70 (BP Location: Left Arm, Patient Position: Sitting, Cuff Size: Small)   Pulse 80   Ht 5\' 2"  (1.575 m)   Wt 115 lb (52.2 kg)   SpO2 97%   BMI 21.03 kg/m    PHYSICAL EXAM:  General: Pt appears well and is in NAD  Lungs: Clear with good BS bilat   Heart: RRR   Extremities:  Lower extremities - No pretibial edema.   Neuro: MS is good with appropriate affect, pt is alert and Ox3    DM foot exam: 12/09/2022  The skin of the feet is intact without sores or ulcerations. The pedal pulses are 2+ on right and 2+ on left. The sensation is intact to a screening 5.07, 10 gram monofilament bilaterally   DATA REVIEWED:  Lab Results  Component Value Date   HGBA1C 7.5 (A) 03/31/2023   HGBA1C 7.5 (A) 12/09/2022   HGBA1C 7.2 (A) 06/10/2022    02/17/2023 BUN/Cr 19/0.8 GFR 81 Ma/Cr ratio 0.7  11/19/2022 A1c 8.1% HDL 58 LDL 44 MA/Cr normal    Old records , labs and images have been reviewed.   ASSESSMENT / PLAN / RECOMMENDATIONS:   1) Type 2 Diabetes Mellitus, Optimally controlled, Without complications - Most recent A1c of 7.5 %. Goal A1c < 7.0 %.    - A1c remains above  goal - Intolerant to higher doses of Trulicity 4.5 mg  -Part of hypoglycemia is due to being off GLP-1 agonist, she is currently in the donut hole, she was provided with patient assistance forms for Ozempic and Jardiance, in the meantime I am going to start her on glipizide to be taken before breakfast, discussed risk of hypoglycemia and the importance of eating within 20 minutes of taking glipizide -Once she is able to get Ozempic, most likely she will not need the glipizide anymore  MEDICATIONS: Continue metformin 500 mg 2 tablets with Breakfast and 2 tablets with Supper Continue Jardiance 25 mg, 1 tablet in the morning  Start glipizide 5 mg, 1 tablet before breakfast  EDUCATION / INSTRUCTIONS: BG monitoring instructions: Patient is instructed to check her blood sugars 3 times a day, before meals . Call [place] [facility] clinic if: BG persistently < 70  I reviewed the Rule of 15 for the treatment of hypoglycemia in detail with the patient. Literature supplied.   2) Diabetic complications:  Eye: Does not have known diabetic retinopathy.  Neuro/ Feet: Does not have known diabetic peripheral neuropathy. Renal: Patient does not have known baseline CKD. She is  not on an ACEI/ARB at present.  3) Hashimoto's Thyroiditis   -Patient is clinically euthyroid -TSH****  Medication  Continue Synthroid 75 mcg   4) Dyslipidemia:  -LDL and TG at goal, continue atorvastatin   Medication  Atorvastatin 10 mg daily    Follow-up in 4 months    Signed electronically by: Lyndle Herrlich, MD  [facility] Endocrinology  [facility] [address] [place], [place] [address] Phone: [phone] FAX: [phone]   CC: Stephanie Ishihara, MD [address] [place] [address]  Phone: [phone]  Fax: [phone]    Return to Endocrinology clinic as below: Future Appointments  Date Time Provider Department Center  09/11/2023   9:50 AM , Konrad Dolores, MD [id number] None

## 2023-04-01 ENCOUNTER — Encounter: Payer: Self-pay | Admitting: Internal Medicine

## 2023-04-01 NOTE — Telephone Encounter (Signed)
LDTVM  

## 2023-04-01 NOTE — Telephone Encounter (Signed)
Patient Advocate Encounter  Prior Authorization for Stephanie [name] has been approved through [employer].    Effective: 03-28-2023 to 03-27-2024

## 2023-04-16 ENCOUNTER — Telehealth: Payer: Self-pay | Admitting: Pharmacy Technician

## 2023-04-16 DIAGNOSIS — Z5986 Financial insecurity: Secondary | ICD-10-CM

## 2023-04-16 NOTE — Progress Notes (Signed)
[facility] Customer service manager [facility])  [facility] Quality Pharmacy Team   04/16/2023  Stephanie [name] 10/24/1956 [id number]  Reason for referral: Medication assistance Medication assistance with Ozempic (in place of Trulicity), Synthroid, Jardiance  Referral source:  Self referral fro patient Current insurance: [employer] C-SNP  Outreach:  Successful telephone call with patient.  HIPAA identifiers verified. Patient was following up with patient assistance applications sent to her in June 2023 for Trulicity and Synthroid. Patient informs her provider has discontinued Trulicity due to lack of commercial availability of product and started patient on Ozempic. Patient is requesting assistance with Ozempic, Jardiance and Synthroid. Patient informs she has [employer]ie application for Synthroid that was mailed to her last June and that provider gave her a [employer] and [employer] application for [employer] and Jardiance respectively. Encouraged patient to complete the applications with AbbVie, [employer] and BI and mail back to [facility]. Patient has [facility] address and was able to repeat the address. Informed patient that [facility] would outreach provider to get signed copy of provider pages of the applications. Discussed with patient the need for proof of income copies as well. Patient verbalized understanding and informs will place in mail today as she is leaving to go out of the country for a few weeks starting 04/17/23.  Medication Review Findings:  Humira-patient is already receiving assistance thru AbbVie Ozempic (Trulicity was discontinued) Jardiance Synthroid  Medication Assistance Findings:  Medication assistance needs identified: Ozempic, Jardiance and Synthroid  Extra Help:  Not eligible for Extra Help Low Income Subsidy based on reported income and assets  Additional medication assistance options reviewed with patient as warranted:  No other options identified  Plan: Will contact Dr. Lonzo Cloud via fax   regarding MD portion of patient assistance applications for Synthroid, Ozempic and Jardiance.  Thank you for allowing [facility] pharmacy to be a part of this patient's care.   P. , CPhT [facility] [employer]  [phone]

## 2023-04-17 ENCOUNTER — Telehealth: Payer: Self-pay | Admitting: Pharmacy Technician

## 2023-04-17 DIAGNOSIS — Z5986 Financial insecurity: Secondary | ICD-10-CM

## 2023-04-17 NOTE — Progress Notes (Signed)
[facility] Customer service manager [facility])                                            [facility] Quality Pharmacy Team    04/17/2023  Stephanie [name] 04/14/1956 [id number]                                      Medication Assistance Referral  Referral From:  Self referral   Medication/Company: [name] / BI Patient application portion:  N/A patient already had application on hand Provider application portion: Faxed  to Dr. Terrace Arabia Provider address/fax verified via: Office website  Medication/Company: [name] / Novo Nordisk Patient application portion:  N/A patient already had application on hand Provider application portion: Faxed  to Dr. Terrace Arabia Provider address/fax verified via: Office website  Medication/Company: [name] / AbbVIe Patient application portion:  N/A patient already had application on hand Provider application portion: Faxed  to Dr. Terrace Arabia Provider address/fax verified via: Office website   P. , CPhT [facility] [employer]  [phone]

## 2023-04-24 ENCOUNTER — Telehealth: Payer: Self-pay | Admitting: Pharmacy Technician

## 2023-04-24 DIAGNOSIS — Z5986 Financial insecurity: Secondary | ICD-10-CM

## 2023-04-24 NOTE — Progress Notes (Signed)
[facility])                                            [facility] Quality Pharmacy Team    04/24/2023  Stephanie [name] [birth year] [id number]  Received both patient and provider portion(s) of patient assistance application(s) for Synthroid, Ozempic and Jardaince. Faxed completed application and required documents into AbbVie, [employer] and BI respectively.     P. , CPhT [facility] [employer]  [phone]

## 2023-04-28 ENCOUNTER — Telehealth: Payer: Self-pay

## 2023-04-28 NOTE — Telephone Encounter (Signed)
Patient advise that she can take the Glipzide daily before breakfast for 1 week and see if that will bring blood sugars down and if not she will have to take BID before meal.  She will watch her card intake and exercise.

## 2023-04-28 NOTE — Telephone Encounter (Signed)
Patient states that she had stop the Glipizide because it was causing her to have diarrhea. Patient states that her sugars have been running high.   04/28/23- 7am  285 fasting  11am 415  2:50pm 246   Patient took to Metformin and Jardiance today.  Patient is waiting for patient assistance on Ozempic and Jardiance through another office.  She is not able to get the Trulicity because they are not accepting anymore application. Patient  can't afford the co-pay for the Ozempic. She is currently in the donut hole and can't afford it out of pocket. Doesn't really want to change to Ozempic because Trulicity has been working and she read that it may cause her to gain weight.     Patient is willing to go back on Glipizide now that she is back .

## 2023-04-30 ENCOUNTER — Telehealth: Payer: Self-pay | Admitting: Pharmacy Technician

## 2023-04-30 DIAGNOSIS — Z5986 Financial insecurity: Secondary | ICD-10-CM

## 2023-04-30 NOTE — Progress Notes (Signed)
[facility])                                            [facility] Quality Pharmacy Team    04/30/2023  Stephanie [name] 1955/12/09 [id number]  3 care coordination calls placed today to AbbVie patient assistance for Synthroid, Jardiance patient assistance for BI and [employer] patient assistance for [employer].  1st call: Spoke to [place] at [facility] in regard to Synthroid application and she informs patient is APPROVED 04/30/23-11/18/23. She informs initial shipment will process automatically and be delivered to the patient's home. Patient will need to call AbbVIe approximately 14 business days before running out of medication to order all subsequent Synthroid refills by calling [phone]. Successful outreach to patient. Patient was informed of this information.  2nd call: Spoke to Dionne at [facility] in regard to [place] application and she informs patient is DENIED as patient is over income for the program. Verified income written on application was income that was entered and patient does meet the income requirements of the program. Successful outreach to patient. Patient was informed of denial. Patient's income does not appearto be within income guidelines for [place] with [place] either. But could be something that could be pursued if patient and provider agree to a switch. Patient informs she will stay on Jardiance for now and may talk about with provider in the future if needed.  3rd call: Spoke to [place] at [employer] in regard to [employer] application ahd she informs patient will need to submit valid of income. The proof of income patient submitted was from 2023 and is not acceptable for the program. Patient was informed of this information.    P. , CPhT [facility] [employer]  [phone]

## 2023-05-07 ENCOUNTER — Telehealth: Payer: Self-pay

## 2023-05-07 NOTE — Telephone Encounter (Signed)
Patient states that she is working with [employer] to get the patient assistance approved for Ozempic. We can disregard the application she filled out here.

## 2023-05-19 DIAGNOSIS — D649 Anemia, unspecified: Secondary | ICD-10-CM | POA: Diagnosis not present

## 2023-05-19 DIAGNOSIS — E611 Iron deficiency: Secondary | ICD-10-CM | POA: Diagnosis not present

## 2023-05-21 ENCOUNTER — Telehealth: Payer: Self-pay | Admitting: Pharmacy Technician

## 2023-05-21 DIAGNOSIS — Z5986 Financial insecurity: Secondary | ICD-10-CM

## 2023-05-21 NOTE — Progress Notes (Signed)
[facility] Customer service manager [facility])                                            [facility] Quality Pharmacy Team    05/21/2023  Stephanie [name] [birth year] [id number]  Care coordination call placed to Novo Nordisk in regard to Ozempic application.  Spoke to Amiya who informs patient is APPROVED 05/21/23-11/18/23. She informs initial and subsequent shipments/refills will be processed automatically and delivered to the prescriber's office. She informs if patient feels current supply is not sufficient until next refill date, then patient can call [employer] at [phone].  Pattricia Boss, CPhT [place]  [facility] Office: [phone] Fax: [phone] Email: [email or web address]

## 2023-06-11 ENCOUNTER — Telehealth: Payer: Self-pay

## 2023-06-11 NOTE — Telephone Encounter (Signed)
Patient aware that [employer] patient assistance is ready for pick up.  (Ozempic).

## 2023-06-13 ENCOUNTER — Ambulatory Visit (INDEPENDENT_AMBULATORY_CARE_PROVIDER_SITE_OTHER): Payer: PPO

## 2023-06-13 ENCOUNTER — Telehealth: Payer: Self-pay

## 2023-06-13 VITALS — BP 110/70 | Ht 62.0 in | Wt 112.0 lb

## 2023-06-13 DIAGNOSIS — E119 Type 2 diabetes mellitus without complications: Secondary | ICD-10-CM

## 2023-06-13 LAB — POCT GLYCOSYLATED HEMOGLOBIN (HGB A1C): Hemoglobin A1C: 7.7 % — AB (ref 4.0–5.6)

## 2023-06-13 NOTE — Progress Notes (Signed)
Patient came in the to pick up samples and requested to have A1c checked.

## 2023-06-13 NOTE — Telephone Encounter (Signed)
Patient picked up 4 boxes of Ozempic  patient assistance.

## 2023-06-25 ENCOUNTER — Encounter: Payer: Self-pay | Admitting: Internal Medicine

## 2023-06-25 ENCOUNTER — Ambulatory Visit: Payer: PPO | Admitting: Internal Medicine

## 2023-06-25 VITALS — BP 124/78 | HR 89 | Ht 62.0 in | Wt 112.8 lb

## 2023-06-25 DIAGNOSIS — E119 Type 2 diabetes mellitus without complications: Secondary | ICD-10-CM | POA: Diagnosis not present

## 2023-06-25 DIAGNOSIS — Z7985 Long-term (current) use of injectable non-insulin antidiabetic drugs: Secondary | ICD-10-CM

## 2023-06-25 DIAGNOSIS — E785 Hyperlipidemia, unspecified: Secondary | ICD-10-CM | POA: Diagnosis not present

## 2023-06-25 DIAGNOSIS — Z7984 Long term (current) use of oral hypoglycemic drugs: Secondary | ICD-10-CM

## 2023-06-25 DIAGNOSIS — E063 Autoimmune thyroiditis: Secondary | ICD-10-CM | POA: Diagnosis not present

## 2023-06-25 MED ORDER — GLIPIZIDE 5 MG PO TABS: 2.5000 mg | ORAL_TABLET | Freq: Every day | ORAL | 3 refills | Status: DC

## 2023-06-25 NOTE — Progress Notes (Signed)
Name: Stephanie [name]  MRN/ DOB: [id number], 04-15-1956   Age/ Sex: 67 y.o., female    PCP: , Konrad Dolores, MD   Reason for Endocrinology Evaluation: Type 2 Diabetes Mellitus/ Hypothyroidism     Date of Initial Endocrinology Visit: 08/06/2021    PATIENT IDENTIFIER: Stephanie [name] is a 67 y.o. female with a past medical history of T2DM, HTN, Hashimoto's Thyroiditis , RLS and RA . The patient presented for initial endocrinology clinic visit on 08/06/2021 for consultative assistance with her diabetes management.    HPI: Ms. Trinh was    Diagnosed with gestational DM in 1988, was diagnosed with T2DM in 2005, she was started on Metformin at the time. GAD-65 negative in 2016 Prior Medications tried/Intolerance: Glipizide, Glyxambi          Hemoglobin A1c has ranged from 6.1 %in 2015, peaking at 7.1% in 2022.   Follow with Rheumatology Dr. Nickola Major for RA . On Plaquanil   We reduce Trulicity 05/2022 due to loose stools, she has chronic nausea which have improved with a smaller dose of Trulicity  She was approved for patient assistance for Ozempic 2024  I started her on glipizide 03/2023 with an A1c of 7.5%   THYROID HISTORY: She has been diagnosed with Hashimoto's Thyroiditis . Synthroid started in 2016  Not on Biotin      SUBJECTIVE:   During the last visit (03/31/2023): A1c 7.5%    Today (06/25/23): Stephanie [name]  is here for a follow up on diabetes management and hypothyroidism. She checks her blood glucose multiple times daily through freestyle libre  She has food aversions, sees a therapist -this is improving  She has made dietary changes through a naturalpath  specialist She did have vomiting the first week of Ozempic but this has resolved  She has no nausea     HOME ENDOCRINE REGIMEN: Metformin 500 mg XR 2 tabs in the morning and 2 tablet at night  Glipizide 5 mg daily Ozempic 1 mg weekly Synthroid 75 mcg daily  Atorvastatin 10 mg daily      Statin: yes ACE-I/ARB: yes     CONTINUOUS GLUCOSE MONITORING RECORD INTERPRETATION    Dates of Recording:7/25-06/25/2023  Sensor description: Freestyle libre   Results statistics:   CGM use % of time 81  Average and SD 156/24.6  Time in range 81%  % Time Above 180 16  % Time above 250 3  % Time Below target 0     Glycemic patterns summary: BG is optimal at night and fluctuating during the day    Hyperglycemic episodes postprandial  Hypoglycemic episodes occurred N/A  Overnight periods: Optimal    DIABETIC COMPLICATIONS: Microvascular complications:   Denies: retinopathy, neuropathy, CKD  Last eye exam: Completed 11/01/2022-  Dr. Elmer Picker   Macrovascular complications:   Denies: CAD, PVD, CVA   PAST HISTORY: Past Medical History:  Past Medical History:  Diagnosis Date   Diabetes (HCC)    Dyslipidemia    GERD (gastroesophageal reflux disease)    Hashimoto's thyroiditis    HLD (hyperlipidemia)    Hypertension    Hypothyroidism    Kidney stones    Pain in joint, ankle and foot 03/30/2015   RA (rheumatoid arthritis) (HCC)    Vitamin D deficiency    Past Surgical History:  Past Surgical History:  Procedure Laterality Date   CARPAL TUNNEL RELEASE Bilateral    CHOLECYSTECTOMY     EXPLORATORY LAPAROTOMY     several for infertility  HEMORRHOID SURGERY     INVITRO SURGERY     GIFT   TONSILLECTOMY      Social History:  reports that she quit smoking about 31 years ago. Her smoking use included cigarettes. She has never used smokeless tobacco. She reports that she does not drink alcohol. No history on file for drug use. Family History:  Family History  Problem Relation Age of Onset   Rheum arthritis Mother    Heart failure Mother    Heart disease Father    Heart attack Father 16   Breast cancer Sister        20s   Diabetes Brother    Diabetes Brother    Kidney cancer Maternal Grandfather    Rheum arthritis Daughter    Hashimoto's  thyroiditis Daughter      HOME MEDICATIONS: Allergies as of 06/25/2023   No Known Allergies      Medication List        Accurate as of June 25, 2023  8:34 AM. If you have any questions, ask your nurse or doctor.          atorvastatin 10 MG tablet Commonly known as: LIPITOR Take 1 tablet (10 mg total) by mouth daily.   cycloSPORINE 0.05 % ophthalmic emulsion Commonly known as: RESTASIS 1 drop 2 (two) times daily. Instill 1 drop into both eyes twice a day.   empagliflozin 25 MG Tabs tablet Commonly known as: Jardiance Take 1 tablet (25 mg total) by mouth daily.   Fish Oil 1200 MG Caps Take by mouth 2 (two) times daily.   FreeStyle Libre 14 Day Sensor Misc 1 DEVICE BY OTHER ROUTE EVERY 14 (FOURTEEN) DAYS. USE AS INSTRUCTED TO CHECK BLOOD SUGAR. CHANGE EVERY 14 DAYS.   glipiZIDE 5 MG tablet Commonly known as: GLUCOTROL Take 1 tablet (5 mg total) by mouth daily before breakfast.   Humira (2 Pen) 40 MG/0.4ML pen Generic drug: adalimumab every 14 (fourteen) days.   hydrocortisone 2.5 % rectal cream Commonly known as: ANUSOL-HC Place 1 Application rectally 2 (two) times daily. For 7 days   hydroxychloroquine 200 MG tablet Commonly known as: PLAQUENIL Take 200 mg by mouth 2 (two) times daily. Pt is only taking 1 a day   lisinopril-hydrochlorothiazide 20-25 MG tablet Commonly known as: ZESTORETIC Take 1 tablet by mouth as directed. Takes 1/2 tablet daily.   meloxicam 15 MG tablet Commonly known as: MOBIC Take 1 tablet by mouth daily.   metFORMIN 500 MG 24 hr tablet Commonly known as: GLUCOPHAGE-XR Take 2 tablets (1,000 mg total) by mouth in the morning and at bedtime.   NEXIUM PO Take by mouth daily.   potassium chloride 10 MEQ CR capsule Commonly known as: MICRO-K Take by mouth daily.   Semaglutide (1 MG/DOSE) 4 MG/3ML Sopn Inject 1 mg as directed once a week.   sertraline 25 MG tablet Commonly known as: ZOLOFT daily.   Synthroid 75 MCG  tablet Generic drug: levothyroxine Take 1 tablet (75 mcg total) by mouth daily before breakfast.   valACYclovir 500 MG tablet Commonly known as: VALTREX Take 1 tablet by mouth daily.   Vitamin D-3 125 MCG (5000 UT) Tabs Take by mouth 3 (three) times a week.         ALLERGIES: No Known Allergies      OBJECTIVE:   VITAL SIGNS: BP 124/78 (BP Location: Left Arm, Patient Position: Sitting, Cuff Size: Small)   Pulse 89   Ht 5\' 2"  (1.575 m)   Wt 112  lb 12.8 oz (51.2 kg)   SpO2 99%   BMI 20.63 kg/m    PHYSICAL EXAM:  General: Pt appears well and is in NAD  Lungs: Clear with good BS bilat   Heart: RRR   Extremities:  Lower extremities - No pretibial edema.   Neuro: MS is good with appropriate affect, pt is alert and Ox3    DM foot exam: 12/09/2022  The skin of the feet is intact without sores or ulcerations. The pedal pulses are 2+ on right and 2+ on left. The sensation is intact to a screening 5.07, 10 gram monofilament bilaterally   DATA REVIEWED:  Lab Results  Component Value Date   HGBA1C 7.7 (A) 06/13/2023   HGBA1C 7.5 (A) 03/31/2023   HGBA1C 7.5 (A) 12/09/2022    Latest Reference Range & Units 03/31/23 13:56  TSH 0.35 - 5.50 uIU/mL 0.48       02/17/2023 BUN/Cr 19/0.8 GFR 81 Ma/Cr ratio 0.7  11/19/2022 A1c 8.1% HDL 58 LDL 44 MA/Cr normal    ASSESSMENT / PLAN / RECOMMENDATIONS:   1) Type 2 Diabetes Mellitus, Optimally controlled, Without complications - Most recent A1c of 7.7 %. Goal A1c < 7.0 %.    - A1c remains above goal, but upon down loading her CGM data she has been noted with drastic improvement in her glucose readings over the past 2 weeks.  She did start a new diet through a natural pathic provider, which I have encouraged her to continue - Intolerant to higher doses of Trulicity 4.5 mg  -Tolerating Ozempic at this time, she is on patient assistance -Will decrease glipizide by 50%, in the hopes to wean her off of it in the  future -Jardiance has been close behavior, and she did not qualify for patient assistance   MEDICATIONS: Continue metformin 500 mg 2 tablets with Breakfast and 2 tablets with Supper Decrease Glipizide 5 mg, Half a tablet before breakfast Continue Ozempic 1 mg weekly  EDUCATION / INSTRUCTIONS: BG monitoring instructions: Patient is instructed to check her blood sugars 3 times a day, before meals . Call [facility] if: BG persistently < 70  I reviewed the Rule of 15 for the treatment of hypoglycemia in detail with the patient. Literature supplied.   2) Diabetic complications:  Eye: Does not have known diabetic retinopathy.  Neuro/ Feet: Does not have known diabetic peripheral neuropathy. Renal: Patient does not have known baseline CKD. She is  not on an ACEI/ARB at present.  3) Hashimoto's Thyroiditis   -Patient is clinically euthyroid -TSH remains within normal range -She is on patient assistance for Synthroid  Medication  Continue Synthroid 75 mcg   4) Dyslipidemia:  -LDL and TG at goal, continue atorvastatin   Medication  Atorvastatin 10 mg daily    Follow-up in 6 months    Signed electronically by: Lyndle Herrlich, MD  [facility] Endocrinology  [facility] [address] [place] [place], [place] [address] Phone: [phone] FAX: [phone]   CC: , Konrad Dolores, MD [address] [place] [address] Phone: [phone]  Fax: [phone]    Return to Endocrinology clinic as below: No future appointments.

## 2023-06-25 NOTE — Patient Instructions (Addendum)
-   Decrease Glipizide 5 mg , half a tablet before Breakfast  - Continue Metformin 500 mg 2 tablets with Breakfast and 2 tablets with Supper - Continue Ozempic 1 mg weekly       HOW TO TREAT LOW BLOOD SUGARS (Blood sugar LESS THAN 70 MG/DL) Please follow the RULE OF 15 for the treatment of hypoglycemia treatment (when your (blood sugars are less than 70 mg/dL)   STEP 1: Take 15 grams of carbohydrates when your blood sugar is low, which includes:  3-4 GLUCOSE TABS  OR 3-4 OZ OF JUICE OR REGULAR SODA OR ONE TUBE OF GLUCOSE GEL    STEP 2: RECHECK blood sugar in 15 MINUTES STEP 3: If your blood sugar is still low at the 15 minute recheck --> then, go back to STEP 1 and treat AGAIN with another 15 grams of carbohydrates.

## 2023-07-11 DIAGNOSIS — D849 Immunodeficiency, unspecified: Secondary | ICD-10-CM | POA: Diagnosis not present

## 2023-07-11 DIAGNOSIS — M792 Neuralgia and neuritis, unspecified: Secondary | ICD-10-CM | POA: Diagnosis not present

## 2023-07-11 DIAGNOSIS — E1165 Type 2 diabetes mellitus with hyperglycemia: Secondary | ICD-10-CM | POA: Diagnosis not present

## 2023-07-11 DIAGNOSIS — E1142 Type 2 diabetes mellitus with diabetic polyneuropathy: Secondary | ICD-10-CM | POA: Diagnosis not present

## 2023-07-11 DIAGNOSIS — B029 Zoster without complications: Secondary | ICD-10-CM | POA: Diagnosis not present

## 2023-07-25 DIAGNOSIS — M792 Neuralgia and neuritis, unspecified: Secondary | ICD-10-CM | POA: Diagnosis not present

## 2023-07-25 DIAGNOSIS — B029 Zoster without complications: Secondary | ICD-10-CM | POA: Diagnosis not present

## 2023-07-25 DIAGNOSIS — E1165 Type 2 diabetes mellitus with hyperglycemia: Secondary | ICD-10-CM | POA: Diagnosis not present

## 2023-08-20 DIAGNOSIS — Z79899 Other long term (current) drug therapy: Secondary | ICD-10-CM | POA: Diagnosis not present

## 2023-08-20 DIAGNOSIS — M0600 Rheumatoid arthritis without rheumatoid factor, unspecified site: Secondary | ICD-10-CM | POA: Diagnosis not present

## 2023-08-20 DIAGNOSIS — M1991 Primary osteoarthritis, unspecified site: Secondary | ICD-10-CM | POA: Diagnosis not present

## 2023-08-20 DIAGNOSIS — Z6820 Body mass index (BMI) 20.0-20.9, adult: Secondary | ICD-10-CM | POA: Diagnosis not present

## 2023-08-20 DIAGNOSIS — M545 Low back pain, unspecified: Secondary | ICD-10-CM | POA: Diagnosis not present

## 2023-08-20 IMAGING — MG MM DIGITAL SCREENING BILAT W/ TOMO AND CAD
8 series · 9 of 24 positions shown · non-contrast
Comparison: Previous exam(s).

CLINICAL DATA: Screening.

EXAM:
DIGITAL SCREENING BILATERAL MAMMOGRAM WITH TOMOSYNTHESIS AND CAD
TECHNIQUE: Bilateral screening digital craniocaudal and mediolateral oblique
mammograms were obtained. Bilateral screening digital breast
tomosynthesis was performed. The images were evaluated with
computer-aided detection.

[L CC synth-2D]
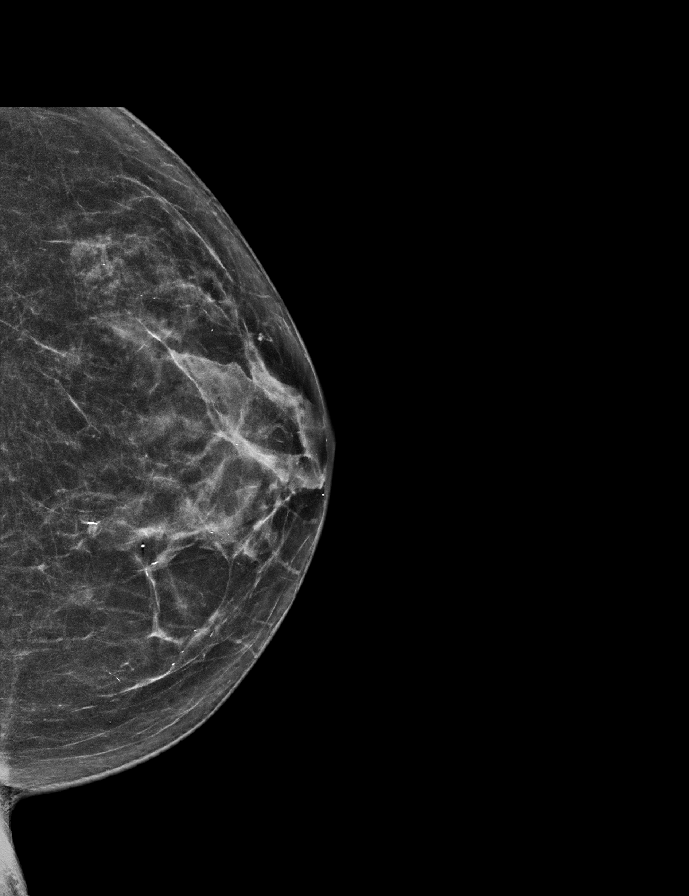

[R MLO synth-2D]
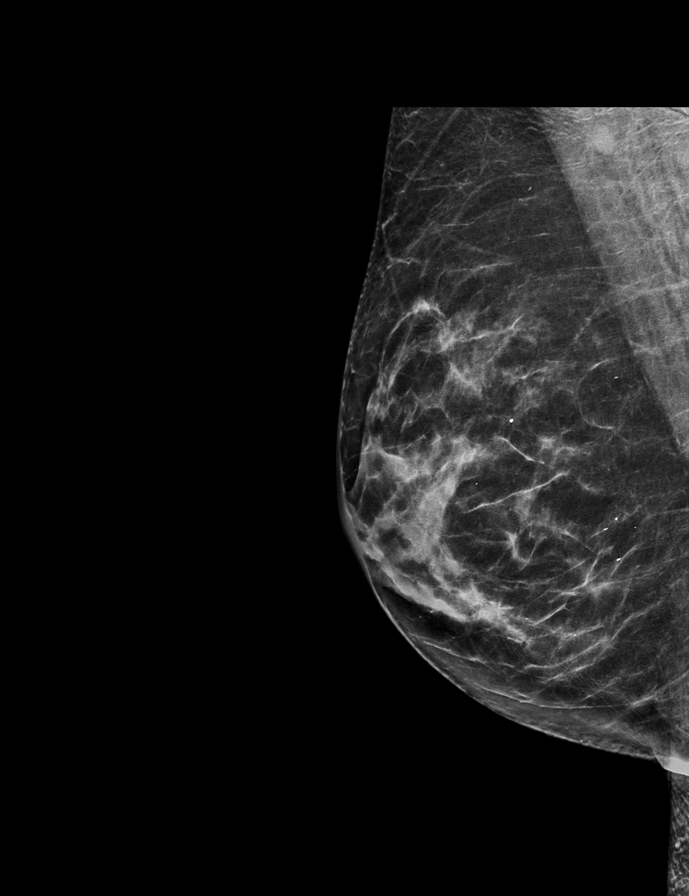

[L MLO synth-2D]
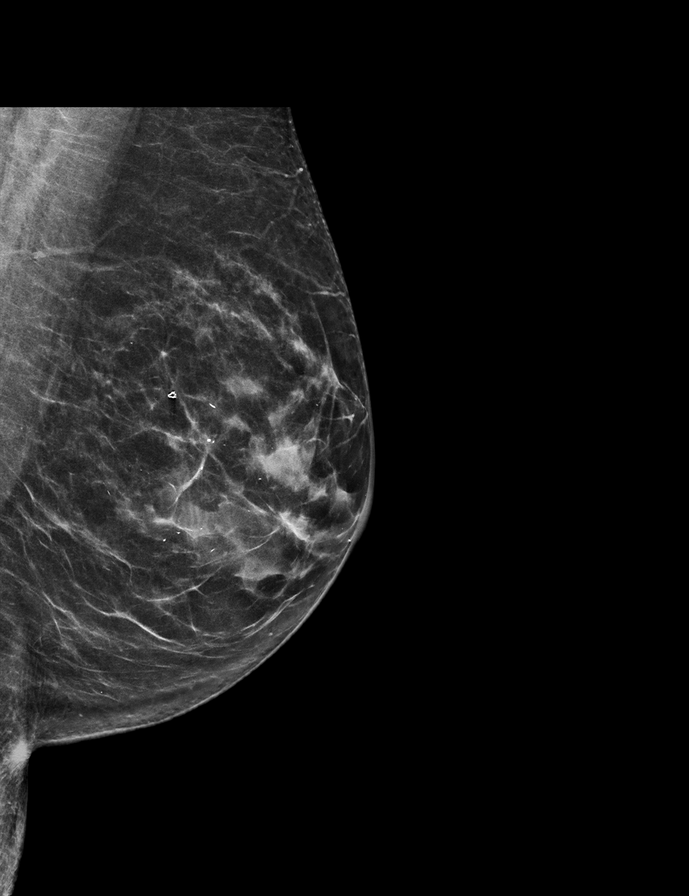

[R CC synth-2D]
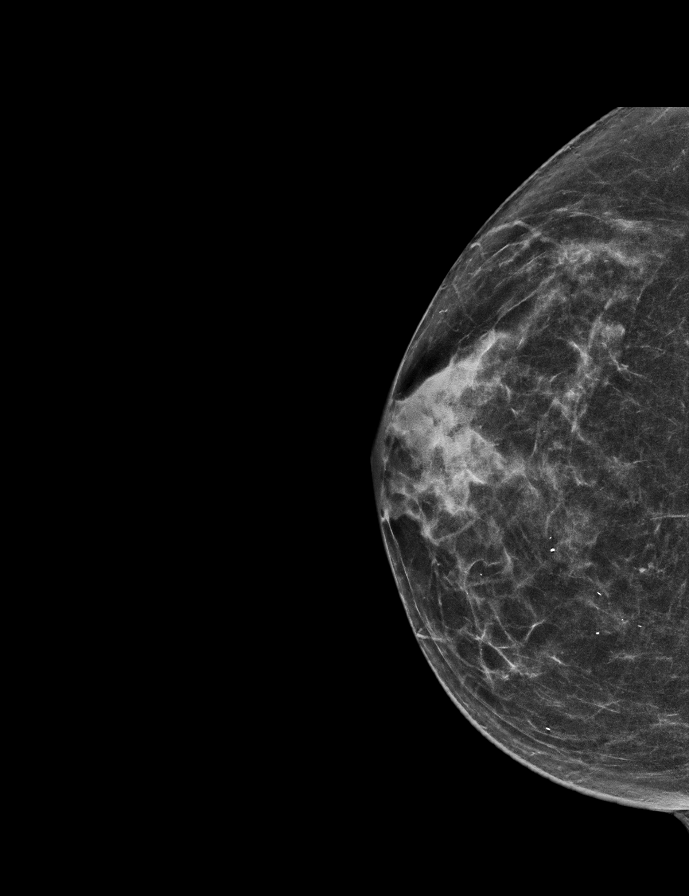

[L CC tomo · 2 of 60 frames shown]
[frame 20/60]
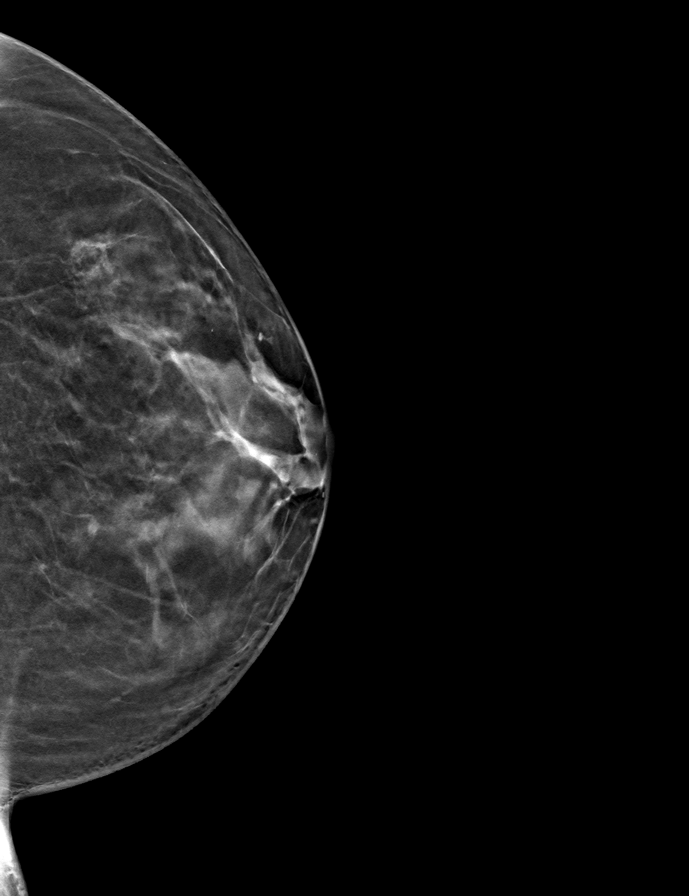
[frame 31/60]
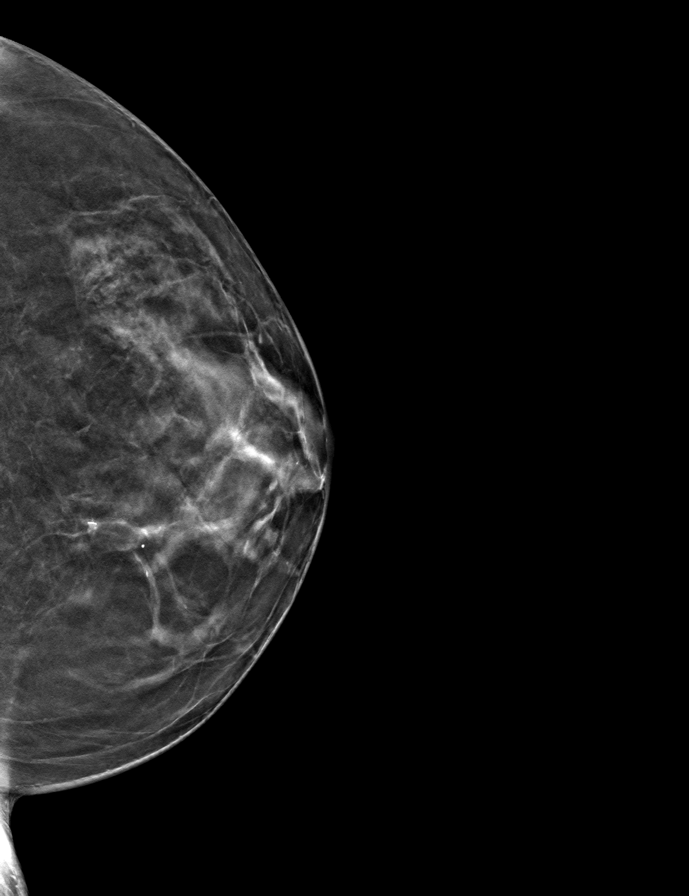

[R CC tomo · tomo slice 29/58.0]
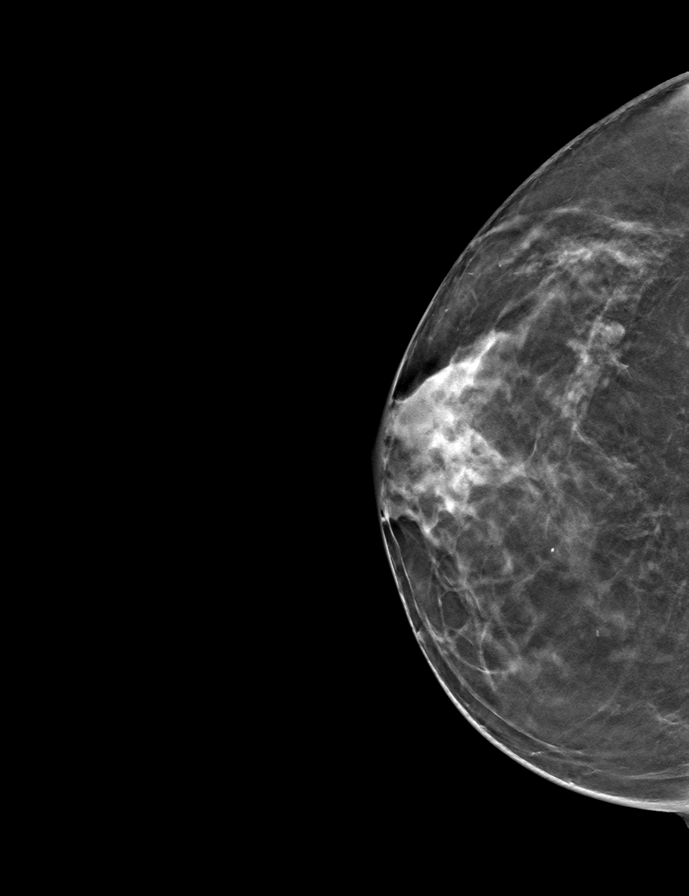

[L MLO tomo · tomo slice 31/62.0]
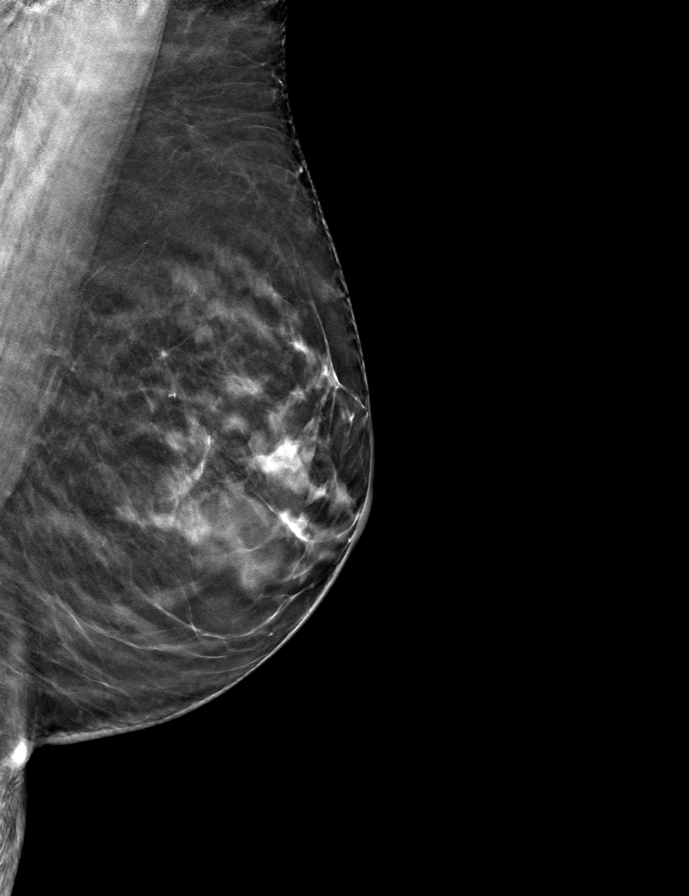

[R MLO tomo · tomo slice 32/63.0]
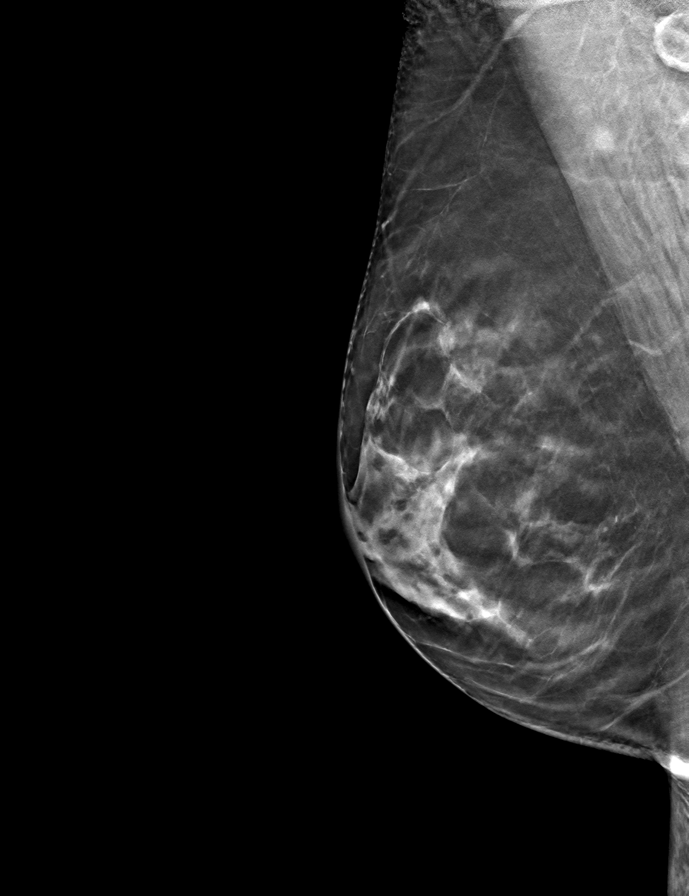

[9 of 24 positions shown; findings below may reference images not displayed]

ACR Breast Density Category c: The breast tissue is heterogeneously
dense, which may obscure small masses.
FINDINGS: There are no findings suspicious for malignancy.
IMPRESSION: No mammographic evidence of malignancy. A result letter of this
screening mammogram will be mailed directly to the patient.

RECOMMENDATION:
Screening mammogram in one year. (Code:Q3-W-BC3)

BI-RADS CATEGORY  1: Negative.

## 2023-08-25 DIAGNOSIS — B029 Zoster without complications: Secondary | ICD-10-CM | POA: Diagnosis not present

## 2023-08-25 DIAGNOSIS — E113293 Type 2 diabetes mellitus with mild nonproliferative diabetic retinopathy without macular edema, bilateral: Secondary | ICD-10-CM | POA: Diagnosis not present

## 2023-08-25 DIAGNOSIS — Z23 Encounter for immunization: Secondary | ICD-10-CM | POA: Diagnosis not present

## 2023-08-25 DIAGNOSIS — D84821 Immunodeficiency due to drugs: Secondary | ICD-10-CM | POA: Diagnosis not present

## 2023-08-25 DIAGNOSIS — Z79620 Long term (current) use of immunosuppressive biologic: Secondary | ICD-10-CM | POA: Diagnosis not present

## 2023-08-25 DIAGNOSIS — E1165 Type 2 diabetes mellitus with hyperglycemia: Secondary | ICD-10-CM | POA: Diagnosis not present

## 2023-08-25 DIAGNOSIS — M069 Rheumatoid arthritis, unspecified: Secondary | ICD-10-CM | POA: Diagnosis not present

## 2023-08-25 DIAGNOSIS — Z8616 Personal history of COVID-19: Secondary | ICD-10-CM | POA: Diagnosis not present

## 2023-09-11 ENCOUNTER — Ambulatory Visit: Payer: PPO | Admitting: Internal Medicine

## 2023-09-12 ENCOUNTER — Telehealth: Payer: Self-pay

## 2023-09-12 NOTE — Telephone Encounter (Signed)
Patient aware that Ozempic 3 boxes have arrived and can be picked up

## 2023-09-15 NOTE — Telephone Encounter (Signed)
Picked up patient assistance - Ozempic 3 boxes - log noted

## 2023-10-01 ENCOUNTER — Telehealth: Payer: Self-pay | Admitting: Pharmacist

## 2023-10-01 NOTE — Progress Notes (Signed)
   10/01/2023  Stephanie [name] [birth year] [id number]   2025 Medication Assistance Renewal Application Summary:  Patient was outreached regarding medication assistance renewal for 2025. Verified address, anticipated insurance for 2025, and income has not changed. Patient remains interested in PAP for 2025 for Synthroid and Ozempic please see below for other medications identified for medication assistance.   Medication Assistance Findings:  Medication assistance needs identified: Patient reports that she also was receiving assistance with Humira through [employer]. Would like assistance with re enrollment for Humira through Dr. Lendon Colonel.      Additional medication assistance options reviewed with patient as warranted:  Insurance OTC catalogue  Plan: Discussed patient case with Noreene Larsson Simcox, CPhT.  Follow needed to understand which team will be assisting patient for 2025 re enrollment.   Thank you for allowing pharmacy to be a part of this patient's care.  Reynold Bowen, PharmD Clinical Pharmacist [place] Direct Dial: [phone]

## 2023-10-03 ENCOUNTER — Telehealth: Payer: Self-pay | Admitting: Pharmacist

## 2023-10-03 NOTE — Progress Notes (Signed)
Placed telephonic outreach to patient today to inform her that her 2025 patient assistance re enrollment case will be transferred to the patient advocate team. Unable to speak with patient. HIPAA compliant voice mail left for patient to return my call.   Reynold Bowen, PharmD Clinical Pharmacist [place] Direct Dial: [phone]

## 2023-10-03 NOTE — Progress Notes (Signed)
Informed to route patient encounter to the patient advocate team for 2025 re enrollment for patient assistance.   Reynold Bowen, PharmD Clinical Pharmacist [place] Direct Dial: [phone]

## 2023-10-03 NOTE — Progress Notes (Signed)
Patient returned my call. I relayed to patient that her re enrollment case has been transferred to the patient advocate team.   Reynold Bowen, PharmD Clinical Pharmacist [place] Direct Dial: [phone]

## 2023-10-07 DIAGNOSIS — H04123 Dry eye syndrome of bilateral lacrimal glands: Secondary | ICD-10-CM | POA: Diagnosis not present

## 2023-10-07 DIAGNOSIS — E119 Type 2 diabetes mellitus without complications: Secondary | ICD-10-CM | POA: Diagnosis not present

## 2023-10-07 DIAGNOSIS — H401131 Primary open-angle glaucoma, bilateral, mild stage: Secondary | ICD-10-CM | POA: Diagnosis not present

## 2023-10-07 DIAGNOSIS — H35363 Drusen (degenerative) of macula, bilateral: Secondary | ICD-10-CM | POA: Diagnosis not present

## 2023-10-07 DIAGNOSIS — Z79899 Other long term (current) drug therapy: Secondary | ICD-10-CM | POA: Diagnosis not present

## 2023-10-07 LAB — HM DIABETES EYE EXAM

## 2023-10-23 ENCOUNTER — Telehealth: Payer: Self-pay | Admitting: Pharmacist

## 2023-10-23 ENCOUNTER — Telehealth: Payer: Self-pay

## 2023-10-23 NOTE — Telephone Encounter (Signed)
Patient will stop by and pick up [employer] application for the Ozempic. I will place it up front.

## 2023-10-23 NOTE — Progress Notes (Signed)
Received phone call from patient regarding follow up on patient assistance for 2025 re enrollment for Synthroid and Ozempic. Patient also receives Humira assistance through [place] prescribed to her from Dr. Nickola Major. Informed patient that I will follow up with the team that is handling her case at this time for next steps and what she can expect. Patient reports that she will be out of town December 17th - January 9th and would like to get this started prior to her departure. Patient will also reach out to Dr. Harvel Ricks office.   Reynold Bowen, PharmD Clinical Pharmacist [place] Direct Dial: [phone]

## 2023-10-24 NOTE — Telephone Encounter (Signed)
Patient picked up Patient Assistance paper work [employer]. Patient dropped off Synthroid patient assistance paper work and form placed in Dr. Harvel Ricks folder

## 2023-10-24 NOTE — Telephone Encounter (Signed)
Patient dropped off Patient assistance paper work for [employer]. Paper work place in [employer]

## 2023-12-03 ENCOUNTER — Ambulatory Visit: Payer: PPO | Admitting: Internal Medicine

## 2023-12-04 ENCOUNTER — Telehealth: Payer: Self-pay

## 2023-12-04 NOTE — Telephone Encounter (Signed)
Patient aware that Ozempic has arrived and can be picked up Ozempic 4 boxes

## 2023-12-05 NOTE — Telephone Encounter (Signed)
Patient came in to office today and picked up 4 boxes of Patient Assistance Ozempic.

## 2023-12-28 ENCOUNTER — Other Ambulatory Visit: Payer: Self-pay | Admitting: Internal Medicine

## 2023-12-28 DIAGNOSIS — E119 Type 2 diabetes mellitus without complications: Secondary | ICD-10-CM

## 2024-01-05 ENCOUNTER — Encounter: Payer: Self-pay | Admitting: Internal Medicine

## 2024-01-05 ENCOUNTER — Ambulatory Visit (INDEPENDENT_AMBULATORY_CARE_PROVIDER_SITE_OTHER): Payer: PPO | Admitting: Internal Medicine

## 2024-01-05 VITALS — BP 122/74 | HR 78 | Ht 62.0 in | Wt 115.0 lb

## 2024-01-05 DIAGNOSIS — Z7984 Long term (current) use of oral hypoglycemic drugs: Secondary | ICD-10-CM | POA: Diagnosis not present

## 2024-01-05 DIAGNOSIS — E119 Type 2 diabetes mellitus without complications: Secondary | ICD-10-CM

## 2024-01-05 LAB — TSH: TSH: 0.79 m[IU]/L (ref 0.40–4.50)

## 2024-01-05 LAB — LIPID PANEL
Cholesterol: 106 mg/dL (ref <200)
HDL: 56 mg/dL (ref >=50)
LDL Cholesterol (Calc): 34 mg/dL
Non-HDL Cholesterol (Calc): 50 mg/dL (ref <130)
Total CHOL/HDL Ratio: 1.9 (calc) (ref <5.0)
Triglycerides: 76 mg/dL (ref <150)

## 2024-01-05 LAB — BASIC METABOLIC PANEL
BUN: 13 mg/dL (ref 7–25)
CO2: 27 mmol/L (ref 20–32)
Calcium: 9.4 mg/dL (ref 8.6–10.4)
Chloride: 100 mmol/L (ref 98–110)
Creat: 0.56 mg/dL (ref 0.50–1.05)
Glucose, Bld: 175 mg/dL — ABNORMAL HIGH (ref 65–99)
Potassium: 4.2 mmol/L (ref 3.5–5.3)
Sodium: 137 mmol/L (ref 135–146)

## 2024-01-05 LAB — MICROALBUMIN / CREATININE URINE RATIO
Creatinine, Urine: 22 mg/dL (ref 20–275)
Microalb Creat Ratio: 14 mg/g{creat} (ref <30)
Microalb, Ur: 0.3 mg/dL

## 2024-01-05 LAB — POCT GLYCOSYLATED HEMOGLOBIN (HGB A1C): Hemoglobin A1C: 6.6 % — AB (ref 4.0–5.6)

## 2024-01-05 MED ORDER — GLIPIZIDE 5 MG PO TABS: 2.5000 mg | ORAL_TABLET | Freq: Every day | ORAL | 3 refills | Status: DC

## 2024-01-05 MED ORDER — METFORMIN HCL ER 500 MG PO TB24: 1000.0000 mg | ORAL_TABLET | Freq: Two times a day (BID) | ORAL | 3 refills | Status: DC

## 2024-01-05 NOTE — Patient Instructions (Addendum)
-   Change Glipizide 5 mg , half a tablet before Supper  - Continue Metformin 500 mg 2 tablets with Breakfast and 2 tablets with Supper - Continue Ozempic 1 mg weekly    HOW TO TREAT LOW BLOOD SUGARS (Blood sugar LESS THAN 70 MG/DL) Please follow the RULE OF 15 for the treatment of hypoglycemia treatment (when your (blood sugars are less than 70 mg/dL)   STEP 1: Take 15 grams of carbohydrates when your blood sugar is low, which includes:  3-4 GLUCOSE TABS  OR 3-4 OZ OF JUICE OR REGULAR SODA OR ONE TUBE OF GLUCOSE GEL    STEP 2: RECHECK blood sugar in 15 MINUTES STEP 3: If your blood sugar is still low at the 15 minute recheck --> then, go back to STEP 1 and treat AGAIN with another 15 grams of carbohydrates.

## 2024-01-05 NOTE — Progress Notes (Unsigned)
 Name: Stephanie [name]  MRN/ DOB: [id number], 03-24-1956   Age/ Sex: 68 y.o., female    PCP: Lorenda Ishihara, MD   Reason for Endocrinology Evaluation: Type 2 Diabetes Mellitus/ Hypothyroidism     Date of Initial Endocrinology Visit: 08/06/2021    PATIENT IDENTIFIER: Stephanie [name] is a 68 y.o. female with a past medical history of T2DM, HTN, Hashimoto's Thyroiditis , RLS and RA . The patient presented for initial endocrinology clinic visit on 08/06/2021 for consultative assistance with her diabetes management.    HPI: Ms. Rothgeb was    Diagnosed with gestational DM in 1988, was diagnosed with T2DM in 2005, she was started on Metformin at the time. GAD-65 negative in 2016 Prior Medications tried/Intolerance: Glipizide, Glyxambi          Hemoglobin A1c has ranged from 6.1 %in 2015, peaking at 7.1% in 2022.   Follow with Rheumatology Dr. Nickola Major for RA . On Plaquanil   We reduce Trulicity 05/2022 due to loose stools, she has chronic nausea which have improved with a smaller dose of Trulicity  She was approved for patient assistance for Ozempic 2024  I started her on glipizide 03/2023 with an A1c of 7.5%   THYROID HISTORY: She has been diagnosed with Hashimoto's Thyroiditis . Synthroid started in 2016  Not on Biotin    SUBJECTIVE:   During the last visit (06/25/2023): A1c 7.7%    Today (01/05/24): Stephanie [name]  is here for a follow up on diabetes management and hypothyroidism. She checks her blood glucose multiple times daily through freestyle libre. No hypoglycemia.   She has food aversions, was seeing a therapist at some point-this is improving  She does have nausea when she is hungry, no vomiting   She has had a lot of stress in the past few months, with loss of  ex-husband, friend and sister   She tends to eat something small if her BG's fall in the low 100s  HOME ENDOCRINE REGIMEN: Metformin 500 mg XR, 2 tabs in the morning and 2 tablet at night   Glipizide 5 mg, half a tablet before Breakfast  Ozempic 1 mg weekly Synthroid 75 mcg daily  Atorvastatin 10 mg daily     Statin: yes ACE-I/ARB: yes     CONTINUOUS GLUCOSE MONITORING RECORD INTERPRETATION    Dates of Recording:2/4-2/17/2025  Sensor description: Freestyle libre   Results statistics:   CGM use % of time 86  Average and SD 166/24.8  Time in range 72%  % Time Above 180 24  % Time above 250 4  % Time Below target 0     Glycemic patterns summary: BGs are optimal overnight and fluctuate during the day  Hyperglycemic episodes postprandial  Hypoglycemic episodes occurred N/A  Overnight periods: Optimal    DIABETIC COMPLICATIONS: Microvascular complications:    Denies: retinopathy, neuropathy, CKD  Last eye exam: Completed 10/07/2023  Macrovascular complications:   Denies: CAD, PVD, CVA   PAST HISTORY: Past Medical History:  Past Medical History:  Diagnosis Date   Diabetes (HCC)    Dyslipidemia    GERD (gastroesophageal reflux disease)    Hashimoto's thyroiditis    HLD (hyperlipidemia)    Hypertension    Hypothyroidism    Kidney stones    Pain in joint, ankle and foot 03/30/2015   RA (rheumatoid arthritis) (HCC)    Vitamin D deficiency    Past Surgical History:  Past Surgical History:  Procedure Laterality Date   CARPAL TUNNEL RELEASE Bilateral  CHOLECYSTECTOMY     EXPLORATORY LAPAROTOMY     several for infertility   HEMORRHOID SURGERY     INVITRO SURGERY     GIFT   TONSILLECTOMY      Social History:  reports that she quit smoking about 32 years ago. Her smoking use included cigarettes. She has never used smokeless tobacco. She reports that she does not drink alcohol. No history on file for drug use. Family History:  Family History  Problem Relation Age of Onset   Rheum arthritis Mother    Heart failure Mother    Heart disease Father    Heart attack Father 76   Breast cancer Sister        56s   Diabetes Brother     Diabetes Brother    Kidney cancer Maternal Grandfather    Rheum arthritis Daughter    Hashimoto's thyroiditis Daughter      HOME MEDICATIONS: Allergies as of 01/05/2024   No Known Allergies      Medication List        Accurate as of January 05, 2024  9:00 AM. If you have any questions, ask your nurse or doctor.          atorvastatin 10 MG tablet Commonly known as: LIPITOR Take 1 tablet (10 mg total) by mouth daily.   cycloSPORINE 0.05 % ophthalmic emulsion Commonly known as: RESTASIS 1 drop 2 (two) times daily. Instill 1 drop into both eyes twice a day.   Fish Oil 1200 MG Caps Take by mouth 2 (two) times daily.   FreeStyle Libre 14 Day Sensor Misc USE AS INSTRUCTED TO CHECK BLOOD SUGAR. CHANGE EVERY 14 DAYS.   glipiZIDE 5 MG tablet Commonly known as: GLUCOTROL Take 0.5 tablets (2.5 mg total) by mouth daily before breakfast.   Humira (2 Pen) 40 MG/0.4ML pen Generic drug: adalimumab every 14 (fourteen) days.   hydrocortisone 2.5 % rectal cream Commonly known as: ANUSOL-HC Place 1 Application rectally 2 (two) times daily. For 7 days   hydroxychloroquine 200 MG tablet Commonly known as: PLAQUENIL Take 200 mg by mouth 2 (two) times daily. Pt is only taking 1 a day   lisinopril-hydrochlorothiazide 20-25 MG tablet Commonly known as: ZESTORETIC Take 1 tablet by mouth as directed. Takes 1/2 tablet daily.   meloxicam 15 MG tablet Commonly known as: MOBIC Take 1 tablet by mouth daily.   metFORMIN 500 MG 24 hr tablet Commonly known as: GLUCOPHAGE-XR Take 2 tablets (1,000 mg total) by mouth in the morning and at bedtime.   NEXIUM PO Take by mouth daily.   potassium chloride 10 MEQ CR capsule Commonly known as: MICRO-K Take by mouth daily.   Semaglutide (1 MG/DOSE) 4 MG/3ML Sopn Inject 1 mg as directed once a week.   sertraline 25 MG tablet Commonly known as: ZOLOFT daily.   Spikevax syringe Generic drug: COVID-19 mRNA vaccine   Synthroid 75 MCG  tablet Generic drug: levothyroxine Take 1 tablet (75 mcg total) by mouth daily before breakfast.   valACYclovir 500 MG tablet Commonly known as: VALTREX Take 1 tablet by mouth daily.   Vitamin D-3 125 MCG (5000 UT) Tabs Take by mouth 3 (three) times a week.         ALLERGIES: No Known Allergies      OBJECTIVE:   VITAL SIGNS: BP 122/74 (BP Location: Left Arm, Patient Position: Sitting, Cuff Size: Small)   Pulse 78   Ht 5\' 2"  (1.575 m)   Wt 115 lb (52.2 kg)  SpO2 98%   BMI 21.03 kg/m    PHYSICAL EXAM:  General: Pt appears well and is in NAD  Lungs: Clear with good BS bilat   Heart: RRR   Extremities:  Lower extremities - No pretibial edema.   Neuro: MS is good with appropriate affect, pt is alert and Ox3    DM foot exam: 01/05/2024  The skin of the feet is intact without sores or ulcerations. The pedal pulses are 2+ on right and 2+ on left. The sensation is intact to a screening 5.07, 10 gram monofilament bilaterally   DATA REVIEWED:  Lab Results  Component Value Date   HGBA1C 6.6 (A) 01/05/2024   HGBA1C 7.7 (A) 06/13/2023   HGBA1C 7.5 (A) 03/31/2023   ****   ASSESSMENT / PLAN / RECOMMENDATIONS:   1) Type 2 Diabetes Mellitus, Optimally controlled, Without complications - Most recent A1c of 6.6 %. Goal A1c < 7.0 %.    -Her A1c have optimize despite dietary indiscretions.   The patient - Intolerant to higher doses of Trulicity 4.5 mg  -Tolerating Ozempic at this time, she is on patient assistance -I will switch glipizide from morning time to suppertime.  As she tends to have tight BG's during the day after breakfast -If we see that she needs glipizide in the morning as well, I will switch her to repaglinide due to shorter half-life  MEDICATIONS: Continue metformin 500 mg 2 tablets with Breakfast and 2 tablets with Supper Switch glipizide 5 mg, Half a tablet before supper Continue Ozempic 1 mg weekly  EDUCATION / INSTRUCTIONS: BG monitoring  instructions: Patient is instructed to check her blood sugars 3 times a day, before meals . Call [facility] if: BG persistently < 70  I reviewed the Rule of 15 for the treatment of hypoglycemia in detail with the patient. Literature supplied.   2) Diabetic complications:  Eye: Does not have known diabetic retinopathy.  Neuro/ Feet: Does not have known diabetic peripheral neuropathy. Renal: Patient does not have known baseline CKD. She is  not on an ACEI/ARB at present.  3) Hashimoto's Thyroiditis   -Patient is clinically euthyroid -TSH remains within normal range -She is on patient assistance for Synthroid  Medication  Continue Synthroid 75 mcg   4) Dyslipidemia:  -LDL and TG at goal, continue atorvastatin   Medication  Atorvastatin 10 mg daily    Follow-up in 6 months    Signed electronically by: Lyndle Herrlich, MD  [facility]  [facility] [address] [place], [place] [address] Phone: [phone] FAX: [phone]   CC: Lorenda Ishihara, MD [address] [place] [address] Phone: [phone]  Fax: [phone]    Return to Endocrinology clinic as below: No future appointments.

## 2024-01-06 ENCOUNTER — Encounter: Payer: Self-pay | Admitting: Internal Medicine

## 2024-01-06 MED ORDER — ATORVASTATIN CALCIUM 10 MG PO TABS
10.0000 mg | ORAL_TABLET | Freq: Every day | ORAL | 3 refills | Status: AC
Start: 2024-01-06 — End: ?

## 2024-02-18 DIAGNOSIS — Z79899 Other long term (current) drug therapy: Secondary | ICD-10-CM | POA: Diagnosis not present

## 2024-02-18 DIAGNOSIS — R5383 Other fatigue: Secondary | ICD-10-CM | POA: Diagnosis not present

## 2024-02-18 DIAGNOSIS — M1991 Primary osteoarthritis, unspecified site: Secondary | ICD-10-CM | POA: Diagnosis not present

## 2024-02-18 DIAGNOSIS — R42 Dizziness and giddiness: Secondary | ICD-10-CM | POA: Diagnosis not present

## 2024-02-18 DIAGNOSIS — Z6820 Body mass index (BMI) 20.0-20.9, adult: Secondary | ICD-10-CM | POA: Diagnosis not present

## 2024-02-18 DIAGNOSIS — M0600 Rheumatoid arthritis without rheumatoid factor, unspecified site: Secondary | ICD-10-CM | POA: Diagnosis not present

## 2024-02-18 DIAGNOSIS — R197 Diarrhea, unspecified: Secondary | ICD-10-CM | POA: Diagnosis not present

## 2024-03-15 NOTE — Telephone Encounter (Signed)
 Patient picked up patient assistance - Log Noted

## 2024-03-19 ENCOUNTER — Other Ambulatory Visit: Payer: Self-pay | Admitting: Internal Medicine

## 2024-03-19 DIAGNOSIS — Z1231 Encounter for screening mammogram for malignant neoplasm of breast: Secondary | ICD-10-CM

## 2024-03-25 ENCOUNTER — Other Ambulatory Visit: Payer: Self-pay | Admitting: Internal Medicine

## 2024-03-26 ENCOUNTER — Ambulatory Visit
Admission: RE | Admit: 2024-03-26 | Discharge: 2024-03-26 | Disposition: A | Discharge status: Home / Self Care | Source: Ambulatory Visit | Attending: Internal Medicine | Admitting: Internal Medicine

## 2024-03-26 DIAGNOSIS — Z1231 Encounter for screening mammogram for malignant neoplasm of breast: Secondary | ICD-10-CM | POA: Diagnosis not present

## 2024-04-02 DIAGNOSIS — I10 Essential (primary) hypertension: Secondary | ICD-10-CM | POA: Diagnosis not present

## 2024-04-02 DIAGNOSIS — R29818 Other symptoms and signs involving the nervous system: Secondary | ICD-10-CM | POA: Diagnosis not present

## 2024-04-02 DIAGNOSIS — Z79620 Long term (current) use of immunosuppressive biologic: Secondary | ICD-10-CM | POA: Diagnosis not present

## 2024-04-02 DIAGNOSIS — R531 Weakness: Secondary | ICD-10-CM | POA: Diagnosis not present

## 2024-04-02 DIAGNOSIS — M069 Rheumatoid arthritis, unspecified: Secondary | ICD-10-CM | POA: Diagnosis not present

## 2024-04-02 DIAGNOSIS — R2990 Unspecified symptoms and signs involving the nervous system: Secondary | ICD-10-CM | POA: Diagnosis not present

## 2024-04-02 DIAGNOSIS — G459 Transient cerebral ischemic attack, unspecified: Secondary | ICD-10-CM | POA: Diagnosis not present

## 2024-04-02 DIAGNOSIS — I6523 Occlusion and stenosis of bilateral carotid arteries: Secondary | ICD-10-CM | POA: Diagnosis not present

## 2024-04-02 DIAGNOSIS — E1165 Type 2 diabetes mellitus with hyperglycemia: Secondary | ICD-10-CM | POA: Diagnosis not present

## 2024-04-02 DIAGNOSIS — R479 Unspecified speech disturbances: Secondary | ICD-10-CM | POA: Diagnosis not present

## 2024-04-02 DIAGNOSIS — E119 Type 2 diabetes mellitus without complications: Secondary | ICD-10-CM | POA: Diagnosis not present

## 2024-04-02 DIAGNOSIS — Z87891 Personal history of nicotine dependence: Secondary | ICD-10-CM | POA: Diagnosis not present

## 2024-04-02 DIAGNOSIS — E785 Hyperlipidemia, unspecified: Secondary | ICD-10-CM | POA: Diagnosis not present

## 2024-04-02 DIAGNOSIS — E039 Hypothyroidism, unspecified: Secondary | ICD-10-CM | POA: Diagnosis not present

## 2024-04-02 DIAGNOSIS — R4701 Aphasia: Secondary | ICD-10-CM | POA: Diagnosis not present

## 2024-04-02 DIAGNOSIS — Z9049 Acquired absence of other specified parts of digestive tract: Secondary | ICD-10-CM | POA: Diagnosis not present

## 2024-04-02 DIAGNOSIS — I639 Cerebral infarction, unspecified: Secondary | ICD-10-CM | POA: Diagnosis not present

## 2024-04-02 DIAGNOSIS — E876 Hypokalemia: Secondary | ICD-10-CM | POA: Diagnosis not present

## 2024-04-19 DIAGNOSIS — I10 Essential (primary) hypertension: Secondary | ICD-10-CM | POA: Diagnosis not present

## 2024-04-19 DIAGNOSIS — K589 Irritable bowel syndrome without diarrhea: Secondary | ICD-10-CM | POA: Diagnosis not present

## 2024-04-19 DIAGNOSIS — R350 Frequency of micturition: Secondary | ICD-10-CM | POA: Diagnosis not present

## 2024-04-19 DIAGNOSIS — E1165 Type 2 diabetes mellitus with hyperglycemia: Secondary | ICD-10-CM | POA: Diagnosis not present

## 2024-04-19 DIAGNOSIS — G459 Transient cerebral ischemic attack, unspecified: Secondary | ICD-10-CM | POA: Diagnosis not present

## 2024-04-19 DIAGNOSIS — E876 Hypokalemia: Secondary | ICD-10-CM | POA: Diagnosis not present

## 2024-04-21 DIAGNOSIS — Z1283 Encounter for screening for malignant neoplasm of skin: Secondary | ICD-10-CM | POA: Diagnosis not present

## 2024-04-21 DIAGNOSIS — L570 Actinic keratosis: Secondary | ICD-10-CM | POA: Diagnosis not present

## 2024-04-21 DIAGNOSIS — D225 Melanocytic nevi of trunk: Secondary | ICD-10-CM | POA: Diagnosis not present

## 2024-04-21 DIAGNOSIS — C44719 Basal cell carcinoma of skin of left lower limb, including hip: Secondary | ICD-10-CM | POA: Diagnosis not present

## 2024-04-21 DIAGNOSIS — X32XXXD Exposure to sunlight, subsequent encounter: Secondary | ICD-10-CM | POA: Diagnosis not present

## 2024-04-21 DIAGNOSIS — L820 Inflamed seborrheic keratosis: Secondary | ICD-10-CM | POA: Diagnosis not present

## 2024-04-21 DIAGNOSIS — C4441 Basal cell carcinoma of skin of scalp and neck: Secondary | ICD-10-CM | POA: Diagnosis not present

## 2024-04-21 DIAGNOSIS — C44712 Basal cell carcinoma of skin of right lower limb, including hip: Secondary | ICD-10-CM | POA: Diagnosis not present

## 2024-04-22 DIAGNOSIS — H401131 Primary open-angle glaucoma, bilateral, mild stage: Secondary | ICD-10-CM | POA: Diagnosis not present

## 2024-04-22 DIAGNOSIS — I6609 Occlusion and stenosis of unspecified middle cerebral artery: Secondary | ICD-10-CM | POA: Diagnosis not present

## 2024-04-22 LAB — HM DIABETES EYE EXAM

## 2024-05-03 DIAGNOSIS — M0600 Rheumatoid arthritis without rheumatoid factor, unspecified site: Secondary | ICD-10-CM | POA: Insufficient documentation

## 2024-05-03 DIAGNOSIS — M79609 Pain in unspecified limb: Secondary | ICD-10-CM | POA: Insufficient documentation

## 2024-05-03 DIAGNOSIS — S93431A Sprain of tibiofibular ligament of right ankle, initial encounter: Secondary | ICD-10-CM | POA: Insufficient documentation

## 2024-05-03 DIAGNOSIS — Z79899 Other long term (current) drug therapy: Secondary | ICD-10-CM | POA: Insufficient documentation

## 2024-05-03 DIAGNOSIS — E119 Type 2 diabetes mellitus without complications: Secondary | ICD-10-CM | POA: Insufficient documentation

## 2024-05-03 DIAGNOSIS — R0602 Shortness of breath: Secondary | ICD-10-CM | POA: Insufficient documentation

## 2024-05-03 DIAGNOSIS — F5101 Primary insomnia: Secondary | ICD-10-CM | POA: Insufficient documentation

## 2024-05-03 DIAGNOSIS — M1991 Primary osteoarthritis, unspecified site: Secondary | ICD-10-CM | POA: Insufficient documentation

## 2024-05-03 DIAGNOSIS — J45909 Unspecified asthma, uncomplicated: Secondary | ICD-10-CM | POA: Insufficient documentation

## 2024-05-03 DIAGNOSIS — R1310 Dysphagia, unspecified: Secondary | ICD-10-CM | POA: Insufficient documentation

## 2024-05-03 DIAGNOSIS — M064 Inflammatory polyarthropathy: Secondary | ICD-10-CM | POA: Insufficient documentation

## 2024-05-03 DIAGNOSIS — E7800 Pure hypercholesterolemia, unspecified: Secondary | ICD-10-CM | POA: Insufficient documentation

## 2024-05-03 DIAGNOSIS — M545 Low back pain, unspecified: Secondary | ICD-10-CM | POA: Insufficient documentation

## 2024-05-03 DIAGNOSIS — F32A Depression, unspecified: Secondary | ICD-10-CM | POA: Insufficient documentation

## 2024-05-03 DIAGNOSIS — R079 Chest pain, unspecified: Secondary | ICD-10-CM | POA: Insufficient documentation

## 2024-05-04 ENCOUNTER — Ambulatory Visit: Admitting: Neurology

## 2024-05-04 ENCOUNTER — Encounter: Payer: Self-pay | Admitting: Neurology

## 2024-05-04 VITALS — BP 135/80 | HR 84 | Resp 15

## 2024-05-04 DIAGNOSIS — N390 Urinary tract infection, site not specified: Secondary | ICD-10-CM | POA: Diagnosis not present

## 2024-05-04 DIAGNOSIS — R410 Disorientation, unspecified: Secondary | ICD-10-CM | POA: Diagnosis not present

## 2024-05-04 DIAGNOSIS — E876 Hypokalemia: Secondary | ICD-10-CM | POA: Diagnosis not present

## 2024-05-04 DIAGNOSIS — G459 Transient cerebral ischemic attack, unspecified: Secondary | ICD-10-CM | POA: Diagnosis not present

## 2024-05-04 NOTE — Progress Notes (Signed)
 Chief Complaint  Patient presents with   New Patient (Initial Visit)    Rm15, alone, referral for TIA/Dr. Varadarajan Eagle at [facility] [phone]: pt had cva on vacation in [place]. Pt mentioned having weakness but getting better everyday, balance issues had lasted 2 weeks, vision has improved but was blurry, previously had word finding difficulty      ASSESSMENT AND PLAN  Stephanie [name] is a 68 y.o. female   Acute onset mild confusion right-sided numbness on Apr 02, 2024,  In the setting of not resting well the night before, long travel, electrolyte imbalance, urinary tract infection,  TIA is in the differentiation diagnosis, above factors might explain her transient symptoms,  She does have vascular risk factor of aging, hypertension hyperlipidemia, diabetes  Advised her to continue aspirin 81 mg daily, increase water intake,  Will continue follow-up from primary care physician      DIAGNOSTIC DATA (LABS, IMAGING, TESTING) - I reviewed patient records, labs, notes, testing and imaging myself where available.   MEDICAL HISTORY:  Stephanie [name], is 68 year old female seen in request by her primary care from [facility] Dr. Beauty Bourbon, Willena Harp for evaluation of TIA, initial evaluation May 04, 2024   History is obtained from the patient and review of electronic medical records. I personally reviewed pertinent available imaging films in PACS.   PMHx of  Anemia DM HTN HLD Rheumatoid Arthritis Hypothyroidism Anxiety  She went on the trip to [place]  on Apr 02, 2024, took a long car ride with a group of friend, the night before that, she did not sleep well, after long car ride, at evening time, they were having dinner at a fast food place, she was noted to have slow reaction time, right hand numbness, difficulty picking up her fries, smashed her water cup when trying to put it on, later she also noticed right face numbness, mild confusion, subjective right weakness,  lasting about 1 hour, ambulance was called, she was treated at [facility],  I reviewed records through [facility]   MRI of brain from [facility] was negative on May 17th 2025. CTA of head and neck on May 16th, no large vessel disease Major intracranial arterial and cervical carotid and vertebral arteries are negative for occlusion, hemodynamically significant stenosis, aneurysm or dissection.  ECHO: EF 65-70%, bubble study was negative.  A1C 6.5, Hg 13.5.   normal TSH, LDL 35, UDS negative,  K 3.3 was decreased, magnesium level was also decreased 1.2, evidence of UTI  She had smoldering UTI symptoms, cloudy urine for few months, was treated with antibiotic  She was put on aspirin 325 mg daily, was given higher dose of Lipitor from 10 to 40 mg daily  Her symptoms, mild slow reaction time, fatigue lasting about 2 weeks, now recovered,  Strong family history of vascular disease, father died of heart attack at age 40, sister of 62 died from brain bleeding   PHYSICAL EXAM:   Vitals:   05/04/24 0849  BP: 135/80  Pulse: 84  Resp: 15  SpO2: 94%      PHYSICAL EXAMNIATION:  Gen: NAD, conversant, well nourised, well groomed                     Cardiovascular: Regular rate rhythm, no peripheral edema, warm, nontender. Eyes: Conjunctivae clear without exudates or hemorrhage Neck: Supple, no carotid bruits. Pulmonary: Clear to auscultation bilaterally   NEUROLOGICAL EXAM:  MENTAL STATUS: Speech/cognition: Awake, alert, oriented to history taking and  casual conversation CRANIAL NERVES: CN II: Visual fields are full to confrontation. Pupils are round equal and briskly reactive to light. CN III, IV, VI: extraocular movement are normal. No ptosis. CN V: Facial sensation is intact to light touch CN VII: Face is symmetric with normal eye closure  CN VIII: Hearing is normal to causal conversation. CN IX, X: Phonation is normal. CN XI: Head turning and shoulder shrug are  intact  MOTOR: There is no pronator drift of out-stretched arms. Muscle bulk and tone are normal. Muscle strength is normal.  REFLEXES: Reflexes are 2+ and symmetric at the biceps, triceps, knees, and ankles. Plantar responses are flexor.  SENSORY: Intact to light touch, pinprick and vibratory sensation are intact in fingers and toes.  COORDINATION: There is no trunk or limb dysmetria noted.  GAIT/STANCE: Posture is normal. Gait is steady   REVIEW OF SYSTEMS:  Full 14 system review of systems performed and notable only for as above All other review of systems were negative.   ALLERGIES: No Known Allergies  HOME MEDICATIONS: Current Outpatient Medications  Medication Sig Dispense Refill   Adalimumab (HUMIRA PEN) 40 MG/0.4ML PNKT every 14 (fourteen) days.     atorvastatin  (LIPITOR) 40 MG tablet Take 40 mg by mouth daily.     Cholecalciferol (VITAMIN D-3) 125 MCG (5000 UT) TABS Take by mouth 3 (three) times a week.     Continuous Glucose Sensor (FREESTYLE LIBRE 14 DAY SENSOR) MISC USE AS INSTRUCTED TO CHECK BLOOD SUGAR. CHANGE EVERY 14 DAYS. 6 each 3   cycloSPORINE (RESTASIS) 0.05 % ophthalmic emulsion 1 drop 2 (two) times daily. Instill 1 drop into both eyes twice a day.     Esomeprazole Magnesium (NEXIUM PO) Take by mouth daily.     glipiZIDE  (GLUCOTROL ) 5 MG tablet Take 0.5 tablets (2.5 mg total) by mouth daily before supper. 45 tablet 3   hydroxychloroquine (PLAQUENIL) 200 MG tablet Take 200 mg by mouth 2 (two) times daily. Pt is only taking 1 a day     lisinopril  (ZESTRIL ) 10 MG tablet Take 10 mg by mouth daily.     metFORMIN  (GLUCOPHAGE -XR) 500 MG 24 hr tablet Take 2 tablets (1,000 mg total) by mouth in the morning and at bedtime. 360 tablet 3   Omega-3 Fatty Acids (FISH OIL) 1200 MG CAPS Take by mouth 2 (two) times daily.     potassium chloride (MICRO-K) 10 MEQ CR capsule Take by mouth daily.     Semaglutide , 1 MG/DOSE, 4 MG/3ML SOPN Inject 1 mg as directed once a week. 9  mL 3   sertraline (ZOLOFT) 25 MG tablet daily.     SYNTHROID  75 MCG tablet TAKE 1 TABLET BY MOUTH DAILY BEFORE BREAKFAST. 90 tablet 1   SPIKEVAX syringe      Current Facility-Administered Medications  Medication Dose Route Frequency Provider Last Rate Last Admin   0.9 %  sodium chloride  infusion  500 mL Intravenous Once Daina Drum, MD        PAST MEDICAL HISTORY: Past Medical History:  Diagnosis Date   Diabetes (HCC)    Dyslipidemia    GERD (gastroesophageal reflux disease)    Hashimoto's thyroiditis    HLD (hyperlipidemia)    Hypertension    Hypothyroidism    Kidney stones    Pain in joint, ankle and foot 03/30/2015   RA (rheumatoid arthritis) (HCC)    Vitamin D deficiency     PAST SURGICAL HISTORY: Past Surgical History:  Procedure Laterality Date   CARPAL  TUNNEL RELEASE Bilateral    CHOLECYSTECTOMY     EXPLORATORY LAPAROTOMY     several for infertility   HEMORRHOID SURGERY     INVITRO SURGERY     GIFT   TONSILLECTOMY      FAMILY HISTORY: Family History  Problem Relation Age of Onset   Rheum arthritis Mother    Heart failure Mother    Heart disease Father    Heart attack Father 77   Breast cancer Sister        73s   Diabetes Brother    Diabetes Brother    Kidney cancer Maternal Grandfather    Rheum arthritis Daughter    Hashimoto's thyroiditis Daughter     SOCIAL HISTORY: Social History   Socioeconomic History   Marital status: Divorced    Spouse name: Not on file   Number of children: 1   Years of education: Not on file   Highest education level: Not on file  Occupational History   Occupation: retired  Tobacco Use   Smoking status: Former    Current packs/day: 0.00    Types: Cigarettes    Quit date: 1993    Years since quitting: 32.4   Smokeless tobacco: Never  Vaping Use   Vaping status: Never Used  Substance and Sexual Activity   Alcohol use: No    Alcohol/week: 0.0 standard drinks of alcohol   Drug use: Not on file   Sexual  activity: Not on file  Other Topics Concern   Not on file  Social History Narrative   Not on file   Social Drivers of Health   Financial Resource Strain: Not on file  Food Insecurity: No Food Insecurity (04/03/2024)   Received from [facility] [place]    Hunger Vital Sign    Within the past 12 months, you worried that your food would run out before you got the money to buy more.: Never true    Within the past 12 months, the food you bought just didn't last and you didn't have money to get more.: Never true  Transportation Needs: No Transportation Needs (04/03/2024)   Received from [facility] [place]    PRAPARE - Transportation    In the past 12 months, has lack of transportation kept you from medical appointments or from getting medications?: No    In the past 12 months, has lack of transportation kept you from meetings, work, or from getting things needed for daily living?: No  Physical Activity: Not on file  Stress: Not on file  Social Connections: Not on file  Intimate Partner Violence: Not At Risk (04/03/2024)   Received from [facility] [place]    Humiliation, Afraid, Rape, and Kick questionnaire    Within the last year, have you been afraid of your partner or ex-partner?: No    Within the last year, have you been humiliated or emotionally abused in other ways by your partner or ex-partner?: No    Within the last year, have you been kicked, hit, slapped, or otherwise physically hurt by your partner or ex-partner?: No    Within the last year, have you been raped or forced to have any kind of sexual activity by your partner or ex-partner?: No      Phebe Brasil, M.D. Ph.D.  [facility] [address] [place], [place] [address] Ph: [phone] Fax: [phone]  CC:  Arva Lathe, MD [address] [place],  [place] [address]  Varadarajan,  Willena Harp, MD

## 2024-05-31 DIAGNOSIS — I10 Essential (primary) hypertension: Secondary | ICD-10-CM | POA: Diagnosis not present

## 2024-05-31 DIAGNOSIS — Z8673 Personal history of transient ischemic attack (TIA), and cerebral infarction without residual deficits: Secondary | ICD-10-CM | POA: Diagnosis not present

## 2024-05-31 DIAGNOSIS — Z8639 Personal history of other endocrine, nutritional and metabolic disease: Secondary | ICD-10-CM | POA: Diagnosis not present

## 2024-05-31 DIAGNOSIS — E611 Iron deficiency: Secondary | ICD-10-CM | POA: Diagnosis not present

## 2024-05-31 DIAGNOSIS — E876 Hypokalemia: Secondary | ICD-10-CM | POA: Diagnosis not present

## 2024-05-31 DIAGNOSIS — K219 Gastro-esophageal reflux disease without esophagitis: Secondary | ICD-10-CM | POA: Diagnosis not present

## 2024-05-31 DIAGNOSIS — E1169 Type 2 diabetes mellitus with other specified complication: Secondary | ICD-10-CM | POA: Diagnosis not present

## 2024-06-07 ENCOUNTER — Telehealth: Payer: Self-pay

## 2024-06-07 NOTE — Telephone Encounter (Signed)
 Patient calling after being advised multiple times that Dr Antonetta is not taking new patients she insisted on a message being sent to see if an exception can be made says she was recommended by two friends. Please advise and I will call patient back Thank you

## 2024-06-08 NOTE — Telephone Encounter (Signed)
 Called patient explain not accepting new patients offered other nurse practitioner

## 2024-06-21 DIAGNOSIS — M069 Rheumatoid arthritis, unspecified: Secondary | ICD-10-CM | POA: Diagnosis not present

## 2024-06-21 DIAGNOSIS — E11319 Type 2 diabetes mellitus with unspecified diabetic retinopathy without macular edema: Secondary | ICD-10-CM | POA: Diagnosis not present

## 2024-06-21 DIAGNOSIS — Z79620 Long term (current) use of immunosuppressive biologic: Secondary | ICD-10-CM | POA: Diagnosis not present

## 2024-06-21 DIAGNOSIS — E611 Iron deficiency: Secondary | ICD-10-CM | POA: Diagnosis not present

## 2024-06-21 DIAGNOSIS — Z0000 Encounter for general adult medical examination without abnormal findings: Secondary | ICD-10-CM | POA: Diagnosis not present

## 2024-06-21 DIAGNOSIS — Z8673 Personal history of transient ischemic attack (TIA), and cerebral infarction without residual deficits: Secondary | ICD-10-CM | POA: Diagnosis not present

## 2024-06-21 DIAGNOSIS — E2839 Other primary ovarian failure: Secondary | ICD-10-CM | POA: Diagnosis not present

## 2024-06-21 DIAGNOSIS — E039 Hypothyroidism, unspecified: Secondary | ICD-10-CM | POA: Diagnosis not present

## 2024-06-21 DIAGNOSIS — Z1331 Encounter for screening for depression: Secondary | ICD-10-CM | POA: Diagnosis not present

## 2024-06-21 DIAGNOSIS — E785 Hyperlipidemia, unspecified: Secondary | ICD-10-CM | POA: Diagnosis not present

## 2024-06-21 DIAGNOSIS — E113293 Type 2 diabetes mellitus with mild nonproliferative diabetic retinopathy without macular edema, bilateral: Secondary | ICD-10-CM | POA: Diagnosis not present

## 2024-06-23 ENCOUNTER — Other Ambulatory Visit (HOSPITAL_BASED_OUTPATIENT_CLINIC_OR_DEPARTMENT_OTHER): Payer: Self-pay | Admitting: Internal Medicine

## 2024-06-23 DIAGNOSIS — E2839 Other primary ovarian failure: Secondary | ICD-10-CM

## 2024-07-12 ENCOUNTER — Encounter: Payer: Self-pay | Admitting: Internal Medicine

## 2024-07-12 ENCOUNTER — Ambulatory Visit (INDEPENDENT_AMBULATORY_CARE_PROVIDER_SITE_OTHER): Payer: PPO | Admitting: Internal Medicine

## 2024-07-12 VITALS — BP 120/70 | HR 89 | Ht 62.0 in | Wt 115.0 lb

## 2024-07-12 DIAGNOSIS — E119 Type 2 diabetes mellitus without complications: Secondary | ICD-10-CM | POA: Diagnosis not present

## 2024-07-12 DIAGNOSIS — Z7985 Long-term (current) use of injectable non-insulin antidiabetic drugs: Secondary | ICD-10-CM | POA: Diagnosis not present

## 2024-07-12 DIAGNOSIS — E063 Autoimmune thyroiditis: Secondary | ICD-10-CM

## 2024-07-12 DIAGNOSIS — R8290 Unspecified abnormal findings in urine: Secondary | ICD-10-CM | POA: Diagnosis not present

## 2024-07-12 DIAGNOSIS — E785 Hyperlipidemia, unspecified: Secondary | ICD-10-CM

## 2024-07-12 DIAGNOSIS — Z7984 Long term (current) use of oral hypoglycemic drugs: Secondary | ICD-10-CM | POA: Diagnosis not present

## 2024-07-12 LAB — POCT GLYCOSYLATED HEMOGLOBIN (HGB A1C): Hemoglobin A1C: 6.4 % — AB (ref 4.0–5.6)

## 2024-07-12 MED ORDER — METFORMIN HCL ER 500 MG PO TB24: 1000.0000 mg | ORAL_TABLET | Freq: Two times a day (BID) | ORAL | 3 refills | Status: AC

## 2024-07-12 MED ORDER — INSULIN PEN NEEDLE 32G X 4 MM MISC: 1.0000 | 3 refills | Status: AC

## 2024-07-12 MED ORDER — GLIPIZIDE 5 MG PO TABS: 5.0000 mg | ORAL_TABLET | Freq: Every day | ORAL | 3 refills | Status: DC

## 2024-07-12 NOTE — Patient Instructions (Addendum)
-   Change Glipizide  5 mg , 1 tablet before Supper  - Continue Metformin  500 mg 2 tablets with Breakfast and 2 tablets with Supper - Change Ozempic  0.5 mg weekly    HOW TO TREAT LOW BLOOD SUGARS (Blood sugar LESS THAN 70 MG/DL) Please follow the RULE OF 15 for the treatment of hypoglycemia treatment (when your (blood sugars are less than 70 mg/dL)   STEP 1: Take 15 grams of carbohydrates when your blood sugar is low, which includes:  3-4 GLUCOSE TABS  OR 3-4 OZ OF JUICE OR REGULAR SODA OR ONE TUBE OF GLUCOSE GEL    STEP 2: RECHECK blood sugar in 15 MINUTES STEP 3: If your blood sugar is still low at the 15 minute recheck --> then, go back to STEP 1 and treat AGAIN with another 15 grams of carbohydrates.

## 2024-07-12 NOTE — Progress Notes (Unsigned)
 Name: Stephanie [name]  MRN/ DOB: [id number], 08-Feb-1956   Age/ Sex: 68 y.o., female    PCP: Elliot Charm, MD   Reason for Endocrinology Evaluation: Type 2 Diabetes Mellitus/ Hypothyroidism     Date of Initial Endocrinology Visit: 08/06/2021    PATIENT IDENTIFIER: [name] is a 68 y.o. female with a past medical history of T2DM, HTN, Hashimoto's Thyroiditis , RLS and RA . The patient presented for initial endocrinology clinic visit on 08/06/2021 for consultative assistance with her diabetes management.    HPI: [name] was    Diagnosed with gestational DM in 1988, was diagnosed with T2DM in 2005, she was started on Metformin  at the time. GAD-65 negative in 2016 Prior Medications tried/Intolerance: Glipizide , Glyxambi          Hemoglobin A1c has ranged from 6.1 %in 2015, peaking at 7.1% in 2022.   Follow with Rheumatology Dr. Ishmael for RA . On Plaquanil   We reduce Trulicity  05/2022 due to loose stools, she has chronic nausea which have improved with a smaller dose of Trulicity   She was approved for patient assistance for Ozempic  2024  I started her on glipizide  03/2023 with an A1c of 7.5%   THYROID  HISTORY: She has been diagnosed with Hashimoto's Thyroiditis . Synthroid  started in 2016  Not on Biotin    SUBJECTIVE:   During the last visit (01/05/2024): A1c 6.6%    Today (07/12/24): Stephanie [name]  is here for a follow up on diabetes management and hypothyroidism. She checks her blood glucose multiple times daily through freestyle libre. No hypoglycemia.    The patient was evaluated by neurology for acute onset mild confusion and right-sided numbness in May, 2025, TIA was one of the differential but also long travel electrolyte imbalance and UTI were also included.  She is feeling stronger, physically. Speech is better   She has food aversions, was seeing a therapist at some point but this has been improving She follows with [facility]  rheumatology for rheumatoid arthritis  Denies nausea or vomiting  She has noted diarrhea for a few months. Up to date on colonoscopy , diarrhea is improving over the past 2 months. Now its 2-3x a week, on average .  She is going to [place] for the next 2 weeks.  Has noted cloudy and dark urine since being off Abx , she is concerned about a new UTI's   HOME ENDOCRINE REGIMEN: Metformin  500 mg XR, 2 tabs BID Glipizide  5 mg, half a tablet before Supper Ozempic  1 mg weekly Synthroid  75 mcg daily  Atorvastatin  40 mg daily     Statin: yes ACE-I/ARB: yes     CONTINUOUS GLUCOSE MONITORING RECORD INTERPRETATION    Dates of Recording:8/12-8/25/2025  Sensor description: Freestyle libre   Results statistics:   CGM use % of time 73  Average and SD 163/22.8  Time in range 75%  % Time Above 180 21  % Time above 250 4  % Time Below target 0     Glycemic patterns summary: BGs are optimal overnight and fluctuate during the day  Hyperglycemic episodes postprandial  Hypoglycemic episodes occurred N/A  Overnight periods: Optimal    DIABETIC COMPLICATIONS: Microvascular complications:    Denies: retinopathy, neuropathy, CKD  Last eye exam: Completed 10/07/2023  Macrovascular complications:   Denies: CAD, PVD, CVA   PAST HISTORY: Past Medical History:  Past Medical History:  Diagnosis Date   Diabetes (HCC)    Dyslipidemia    GERD (gastroesophageal reflux  disease)    Hashimoto's thyroiditis    HLD (hyperlipidemia)    Hypertension    Hypothyroidism    Kidney stones    Pain in joint, ankle and foot 03/30/2015   RA (rheumatoid arthritis) (HCC)    Vitamin D deficiency    Past Surgical History:  Past Surgical History:  Procedure Laterality Date   CARPAL TUNNEL RELEASE Bilateral    CHOLECYSTECTOMY     EXPLORATORY LAPAROTOMY     several for infertility   HEMORRHOID SURGERY     INVITRO SURGERY     GIFT   TONSILLECTOMY      Social History:  reports that she  quit smoking about 32 years ago. Her smoking use included cigarettes. She has never used smokeless tobacco. She reports that she does not drink alcohol. No history on file for drug use. Family History:  Family History  Problem Relation Age of Onset   Rheum arthritis Mother    Heart failure Mother    Heart disease Father    Heart attack Father 80   Breast cancer Sister        75s   Diabetes Brother    Diabetes Brother    Kidney cancer Maternal Grandfather    Rheum arthritis Daughter    Hashimoto's thyroiditis Daughter      HOME MEDICATIONS: Allergies as of 07/12/2024   No Known Allergies      Medication List        Accurate as of July 12, 2024  8:56 AM. If you have any questions, ask your nurse or doctor.          aspirin EC 81 MG tablet Take 81 mg by mouth daily. Swallow whole.   atorvastatin  40 MG tablet Commonly known as: LIPITOR Take 40 mg by mouth daily.   cycloSPORINE 0.05 % ophthalmic emulsion Commonly known as: RESTASIS 1 drop 2 (two) times daily. Instill 1 drop into both eyes twice a day.   Fish Oil 1200 MG Caps Take by mouth 2 (two) times daily.   FreeStyle Libre 14 Day Sensor Misc USE AS INSTRUCTED TO CHECK BLOOD SUGAR. CHANGE EVERY 14 DAYS.   glipiZIDE  5 MG tablet Commonly known as: GLUCOTROL  Take 0.5 tablets (2.5 mg total) by mouth daily before supper.   Humira (2 Pen) 40 MG/0.4ML pen Generic drug: adalimumab every 14 (fourteen) days.   hydroxychloroquine 200 MG tablet Commonly known as: PLAQUENIL Take 200 mg by mouth 2 (two) times daily. Pt is only taking 1 a day   lisinopril  10 MG tablet Commonly known as: ZESTRIL  Take 10 mg by mouth daily.   metFORMIN  500 MG 24 hr tablet Commonly known as: GLUCOPHAGE -XR Take 2 tablets (1,000 mg total) by mouth in the morning and at bedtime.   NEXIUM PO Take by mouth daily.   potassium chloride 10 MEQ CR capsule Commonly known as: MICRO-K Take by mouth daily.   Semaglutide  (1 MG/DOSE) 4  MG/3ML Sopn Inject 1 mg as directed once a week.   sertraline 25 MG tablet Commonly known as: ZOLOFT daily.   Spikevax syringe Generic drug: COVID-19 mRNA vaccine   Synthroid  75 MCG tablet Generic drug: levothyroxine TAKE 1 TABLET BY MOUTH DAILY BEFORE BREAKFAST.   Vitamin D-3 125 MCG (5000 UT) Tabs Take by mouth 3 (three) times a week.         ALLERGIES: No Known Allergies      OBJECTIVE:   VITAL SIGNS: BP 120/70 (BP Location: Left Arm, Patient Position: Sitting, Cuff Size: Normal)  Pulse 89   Ht 5' 2 (1.575 m)   Wt 115 lb (52.2 kg)   SpO2 98%   BMI 21.03 kg/m    PHYSICAL EXAM:  General: Pt appears well and is in NAD  Lungs: Clear with good BS bilat   Heart: RRR   Extremities:  Lower extremities - No pretibial edema.   Neuro: MS is good with appropriate affect, pt is alert and Ox3    DM foot exam: 01/05/2024  The skin of the feet is intact without sores or ulcerations. The pedal pulses are 2+ on right and 2+ on left. The sensation is intact to a screening 5.07, 10 gram monofilament bilaterally   DATA REVIEWED:  Lab Results  Component Value Date   HGBA1C 6.4 (A) 07/12/2024   HGBA1C 6.6 (A) 01/05/2024   HGBA1C 7.7 (A) 06/13/2023    Latest Reference Range & Units 07/12/24 09:25  Appearance CLEAR  CLEAR  Bilirubin Urine NEGATIVE  NEGATIVE  Color, Urine YELLOW  YELLOW  Glucose, UA NEGATIVE  NEGATIVE  Hgb urine dipstick NEGATIVE  NEGATIVE  Ketones, ur NEGATIVE  NEGATIVE  Leukocyte Esterase NEGATIVE  NEGATIVE  pH 5.0 - 8.0  6.0  Protein NEGATIVE  NEGATIVE  Specific Gravity, Urine 1.001 - 1.035  1.007  Bacteria, UA NONE SEEN /HPF NONE SEEN  Hyaline Cast NONE SEEN /LPF NONE SEEN  RBC / HPF 0 - 2 /HPF NONE SEEN  REFLEXIVE URINE CULTURE  Pend  Squamous Epithelial / HPF < OR = 5 /HPF NONE SEEN  WBC, UA 0 - 5 /HPF NONE SEEN  Nitrites, Initial NEGATIVE  NEGATIVE      Latest Reference Range & Units 01/05/24 09:31  BASIC METABOLIC PANEL  Rpt !   Sodium 135 - 146 mmol/L 137  Potassium 3.5 - 5.3 mmol/L 4.2  Chloride 98 - 110 mmol/L 100  CO2 20 - 32 mmol/L 27  Glucose 65 - 99 mg/dL 824 (H)  BUN 7 - 25 mg/dL 13  Creatinine 9.49 - 8.94 mg/dL 9.43  Calcium  8.6 - 10.4 mg/dL 9.4  BUN/Creatinine Ratio 6 - 22 (calc) SEE NOTE:  Total CHOL/HDL Ratio <5.0 (calc) 1.9  Cholesterol <200 mg/dL 893  HDL Cholesterol > OR = 50 mg/dL 56  LDL Cholesterol (Calc) mg/dL (calc) 34  MICROALB/CREAT RATIO <30 mg/g creat 14  Non-HDL Cholesterol (Calc) <130 mg/dL (calc) 50  Triglycerides <150 mg/dL 76    Latest Reference Range & Units 01/05/24 09:31  TSH 0.40 - 4.50 mIU/L 0.79  Microalb, Ur mg/dL 0.3  MICROALB/CREAT RATIO <30 mg/g creat 14  Creatinine, Urine 20 - 275 mg/dL 22    ASSESSMENT / PLAN / RECOMMENDATIONS:   1) Type 2 Diabetes Mellitus, Optimally controlled, Without complications - Most recent A1c of 6.4 %. Goal A1c < 7.0 %.    -A1c is optimal - Intolerant to higher doses of Trulicity  4.5 mg  -She has noted diarrhea, I suspect this may be related to Ozempic , and she did have similar issues with Trulicity .  I have recommended decreasing Ozempic , she will remain on the 1 mg pen but she will dilate up halfway -I have asked the patient to increase glipizide  before supper.  I initially recommended switching to glimepiride, that way she can take it once a day.  She is going on a trip to [place] and would like to avoid changing her medications drastically before the trip  MEDICATIONS: Continue metformin  500 mg 2 tablets with Breakfast and 2 tablets with Supper Change glipizide  5  mg, 1 tablet before supper Change  Ozempic  1 mg , take 0.5 mg weekly  EDUCATION / INSTRUCTIONS: BG monitoring instructions: Patient is instructed to check her blood sugars 3 times a day, before meals . Call [facility] if: BG persistently < 70  I reviewed the Rule of 15 for the treatment of hypoglycemia in detail with the patient. Literature  supplied.   2) Diabetic complications:  Eye: Does not have known diabetic retinopathy.  Neuro/ Feet: Does not have known diabetic peripheral neuropathy. Renal: Patient does not have known baseline CKD. She is  not on an ACEI/ARB at present.  3) Hashimoto's Thyroiditis   -Patient is clinically euthyroid -Up-to-date on TFTs, which have been normal on current dose -She is on patient assistance for Synthroid   Medication  Continue Synthroid  75 mcg   4) Dyslipidemia:  -LDL and TG at goal, continue atorvastatin  - Her atorvastatin  was increased by her PCP due to possible TIA in May, 2025   Medication  Atorvastatin  40 mg daily   5) Cloudy urine:   - The patient had neurological symptoms in May, 2025 .  One of the differential included and treated UTI.  Today she is concerned, because at the time she had no symptoms, and today she has no symptoms of UTI but has noted cloudy and darker urine - UA today is negative     Follow-up in 6 months    Signed electronically by: Stefano Redgie Butts, MD  [facility]  [facility] [address] [place] [place], [place] [address] Phone: [phone] FAX: [phone]   CC: Elliot Charm, MD [address] [place] [address] Phone: [phone]  Fax: [phone]    Return to Endocrinology clinic as below: No future appointments.

## 2024-07-13 ENCOUNTER — Encounter: Payer: Self-pay | Admitting: Internal Medicine

## 2024-07-13 ENCOUNTER — Ambulatory Visit: Payer: Self-pay | Admitting: Internal Medicine

## 2024-07-13 LAB — URINALYSIS W MICROSCOPIC + REFLEX CULTURE
Bacteria, UA: NONE SEEN /HPF
Bilirubin Urine: NEGATIVE
Glucose, UA: NEGATIVE
Hgb urine dipstick: NEGATIVE
Hyaline Cast: NONE SEEN /LPF
Ketones, ur: NEGATIVE
Leukocyte Esterase: NEGATIVE
Nitrites, Initial: NEGATIVE
Protein, ur: NEGATIVE
RBC / HPF: NONE SEEN /HPF (ref 0–2)
Specific Gravity, Urine: 1.007 (ref 1.001–1.035)
Squamous Epithelial / HPF: NONE SEEN /HPF (ref <=5)
WBC, UA: NONE SEEN /HPF (ref 0–5)
pH: 6 (ref 5.0–8.0)

## 2024-07-13 LAB — NO CULTURE INDICATED

## 2024-08-05 NOTE — Telephone Encounter (Signed)
Patient came in to office today and picked up 4 boxes of patient assistance Ozempic.

## 2024-08-19 DIAGNOSIS — M0600 Rheumatoid arthritis without rheumatoid factor, unspecified site: Secondary | ICD-10-CM | POA: Diagnosis not present

## 2024-08-19 DIAGNOSIS — M1991 Primary osteoarthritis, unspecified site: Secondary | ICD-10-CM | POA: Diagnosis not present

## 2024-08-19 DIAGNOSIS — R5383 Other fatigue: Secondary | ICD-10-CM | POA: Diagnosis not present

## 2024-08-19 DIAGNOSIS — Z79899 Other long term (current) drug therapy: Secondary | ICD-10-CM | POA: Diagnosis not present

## 2024-08-19 DIAGNOSIS — Z6820 Body mass index (BMI) 20.0-20.9, adult: Secondary | ICD-10-CM | POA: Diagnosis not present

## 2024-08-19 DIAGNOSIS — R197 Diarrhea, unspecified: Secondary | ICD-10-CM | POA: Diagnosis not present

## 2024-08-25 ENCOUNTER — Ambulatory Visit

## 2024-09-16 ENCOUNTER — Ambulatory Visit (HOSPITAL_BASED_OUTPATIENT_CLINIC_OR_DEPARTMENT_OTHER)
Admission: RE | Admit: 2024-09-16 | Discharge: 2024-09-16 | Disposition: A | Discharge status: Home / Self Care | Source: Ambulatory Visit | Attending: Internal Medicine | Admitting: Internal Medicine

## 2024-09-16 DIAGNOSIS — M85851 Other specified disorders of bone density and structure, right thigh: Secondary | ICD-10-CM | POA: Diagnosis not present

## 2024-09-16 DIAGNOSIS — E2839 Other primary ovarian failure: Secondary | ICD-10-CM | POA: Diagnosis not present

## 2024-09-16 DIAGNOSIS — Z780 Asymptomatic menopausal state: Secondary | ICD-10-CM | POA: Diagnosis not present

## 2024-09-16 DIAGNOSIS — M85852 Other specified disorders of bone density and structure, left thigh: Secondary | ICD-10-CM | POA: Diagnosis not present

## 2024-09-20 DIAGNOSIS — L258 Unspecified contact dermatitis due to other agents: Secondary | ICD-10-CM | POA: Diagnosis not present

## 2024-09-20 DIAGNOSIS — L820 Inflamed seborrheic keratosis: Secondary | ICD-10-CM | POA: Diagnosis not present

## 2024-09-24 DIAGNOSIS — E119 Type 2 diabetes mellitus without complications: Secondary | ICD-10-CM | POA: Diagnosis not present

## 2024-09-24 DIAGNOSIS — H35363 Drusen (degenerative) of macula, bilateral: Secondary | ICD-10-CM | POA: Diagnosis not present

## 2024-09-24 DIAGNOSIS — H401131 Primary open-angle glaucoma, bilateral, mild stage: Secondary | ICD-10-CM | POA: Diagnosis not present

## 2024-09-24 DIAGNOSIS — Z79899 Other long term (current) drug therapy: Secondary | ICD-10-CM | POA: Diagnosis not present

## 2024-09-24 DIAGNOSIS — H524 Presbyopia: Secondary | ICD-10-CM | POA: Diagnosis not present

## 2024-11-02 ENCOUNTER — Telehealth: Payer: Self-pay

## 2024-11-02 NOTE — Telephone Encounter (Signed)
 Patient called to check on the arrival of her patient asst medication Ozempic . Fridge checked no shipment has arrived for patient. Patient made aware. Patient request to get call when it arrives states she does not use her Mychart.

## 2024-11-15 ENCOUNTER — Telehealth: Payer: Self-pay

## 2024-11-15 MED ORDER — OZEMPIC (0.25 OR 0.5 MG/DOSE) 2 MG/3ML ~~LOC~~ SOPN: PEN_INJECTOR | SUBCUTANEOUS | Status: DC

## 2024-11-15 NOTE — Telephone Encounter (Signed)
 Patient called left VM to check on patient asst. Still has not arrived. Patient called and made aware. Sample left in fridge for patient.

## 2024-11-16 NOTE — Telephone Encounter (Signed)
Patient picked up Ozempic sample 

## 2024-12-04 ENCOUNTER — Other Ambulatory Visit: Payer: Self-pay | Admitting: Internal Medicine

## 2024-12-04 DIAGNOSIS — E119 Type 2 diabetes mellitus without complications: Secondary | ICD-10-CM

## 2024-12-15 ENCOUNTER — Ambulatory Visit: Admitting: Internal Medicine

## 2024-12-15 ENCOUNTER — Other Ambulatory Visit

## 2024-12-15 ENCOUNTER — Encounter: Payer: Self-pay | Admitting: Internal Medicine

## 2024-12-15 VITALS — BP 140/80 | Ht 62.0 in | Wt 118.0 lb

## 2024-12-15 DIAGNOSIS — Z7985 Long-term (current) use of injectable non-insulin antidiabetic drugs: Secondary | ICD-10-CM

## 2024-12-15 DIAGNOSIS — Z7984 Long term (current) use of oral hypoglycemic drugs: Secondary | ICD-10-CM

## 2024-12-15 DIAGNOSIS — E119 Type 2 diabetes mellitus without complications: Secondary | ICD-10-CM

## 2024-12-15 DIAGNOSIS — E063 Autoimmune thyroiditis: Secondary | ICD-10-CM | POA: Diagnosis not present

## 2024-12-15 DIAGNOSIS — E785 Hyperlipidemia, unspecified: Secondary | ICD-10-CM

## 2024-12-15 DIAGNOSIS — E1165 Type 2 diabetes mellitus with hyperglycemia: Secondary | ICD-10-CM | POA: Diagnosis not present

## 2024-12-15 LAB — POCT GLYCOSYLATED HEMOGLOBIN (HGB A1C): Hemoglobin A1C: 7.7 % — AB (ref 4.0–5.6)

## 2024-12-15 MED ORDER — FREESTYLE LIBRE 3 READER DEVI: 1.0000 | 3 refills | Status: AC

## 2024-12-15 MED ORDER — FREESTYLE LIBRE 3 PLUS SENSOR MISC: 1.0000 | 3 refills | Status: AC

## 2024-12-15 MED ORDER — SEMAGLUTIDE (1 MG/DOSE) 4 MG/3ML ~~LOC~~ SOPN: 1.0000 mg | PEN_INJECTOR | SUBCUTANEOUS | 3 refills | Status: AC

## 2024-12-15 MED ORDER — GLIPIZIDE 5 MG PO TABS: 5.0000 mg | ORAL_TABLET | Freq: Two times a day (BID) | ORAL | 3 refills | Status: AC

## 2024-12-15 NOTE — Progress Notes (Unsigned)
 " Name: Stephanie [name]  MRN/ DOB: [id number], September 28, 1956   Age/ Sex: 69 y.o., female    PCP: Ransom Other, MD   Reason for Endocrinology Evaluation: Type 2 Diabetes Mellitus/ Hypothyroidism     Date of Initial Endocrinology Visit: 08/06/2021    PATIENT IDENTIFIER: Stephanie [name] is a 69 y.o. female with a past medical history of T2DM, HTN, Hashimoto's Thyroiditis , RLS and RA . The patient presented for initial endocrinology clinic visit on 08/06/2021 for consultative assistance with her diabetes management.    HPI: Ms. Belli was    Diagnosed with gestational DM in 1988, was diagnosed with T2DM in 2005, she was started on Metformin  at the time. GAD-65 negative in 2016 Prior Medications tried/Intolerance: Glipizide , Glyxambi          Hemoglobin A1c has ranged from 6.1 %in 2015, peaking at 7.1% in 2022.   Follow with Rheumatology Dr. Ishmael for RA . On Plaquanil   We reduce Trulicity  05/2022 due to loose stools, she has chronic nausea which have improved with a smaller dose of Trulicity   She was approved for patient assistance for Ozempic  2024  I started her on glipizide  03/2023 with an A1c of 7.5%   THYROID  HISTORY: She has been diagnosed with Hashimoto's Thyroiditis . Synthroid  started in 2016  Not on Biotin    SUBJECTIVE:   During the last visit (07/12/2024): A1c 6.4%    Today (12/15/24): Stephanie [name]  is here for a follow up on diabetes management and hypothyroidism. She checks her blood glucose multiple times daily through freestyle libre. No hypoglycemia.    She continues to follow-up with [facility] rheumatology for rheumatoid arthritis She has food aversions, was seeing a therapist at some point but this has been  She is up-to-date on eye exam 09/2024   She didn't reduce Ozempic  to 0.5 mg due to hyperglycemia  She has been on a smaller dose due to lack of pt assistance , has self increased glipizide  before breakfast and supper   No recent diarrhea or  nausea  No palpitations   Patient is concerned about cortisol, as she is still on the Internet symptoms of elevated cortisol which she does endorse.  I did explain to the patient that she does not have any features of Cushing syndrome, people with stress generally do have a higher level of cortisol, that is most likely trying to sell a supplement of something, but from the endocrinology standpoint we do treat tumor producing cortisol not stress related  HOME ENDOCRINE REGIMEN: Metformin  500 mg XR, 2 tabs BID Glipizide  5 mg, 1 tablet before Supper Ozempic  1 mg weekly Synthroid  75 mcg daily  Atorvastatin  40 mg daily     Statin: yes ACE-I/ARB: yes     CONTINUOUS GLUCOSE MONITORING RECORD INTERPRETATION                      Results statistics:   CGM use % of time 72  Average and SD 210/  Time in range 25%  % Time Above 180 59  % Time above 250 16  % Time Below target 0         DIABETIC COMPLICATIONS: Microvascular complications:    Denies: retinopathy, neuropathy, CKD  Last eye exam: Completed 09/24/2024  Macrovascular complications:   Denies: CAD, PVD, CVA   PAST HISTORY: Past Medical History:  Past Medical History:  Diagnosis Date   Diabetes (HCC)    Dyslipidemia    GERD (gastroesophageal  reflux disease)    Hashimoto's thyroiditis    HLD (hyperlipidemia)    Hypertension    Hypothyroidism    Kidney stones    Pain in joint, ankle and foot 03/30/2015   RA (rheumatoid arthritis) (HCC)    Vitamin D deficiency    Past Surgical History:  Past Surgical History:  Procedure Laterality Date   CARPAL TUNNEL RELEASE Bilateral    CHOLECYSTECTOMY     EXPLORATORY LAPAROTOMY     several for infertility   HEMORRHOID SURGERY     INVITRO SURGERY     GIFT   TONSILLECTOMY      Social History:  reports that she quit smoking about 33 years ago. Her smoking use included cigarettes. She has never used smokeless tobacco. She reports that she does not  drink alcohol. No history on file for drug use. Family History:  Family History  Problem Relation Age of Onset   Rheum arthritis Mother    Heart failure Mother    Heart disease Father    Heart attack Father 24   Breast cancer Sister        94s   Diabetes Brother    Diabetes Brother    Kidney cancer Maternal Grandfather    Rheum arthritis Daughter    Hashimoto's thyroiditis Daughter      HOME MEDICATIONS: Allergies as of 12/15/2024   No Known Allergies      Medication List        Accurate as of December 15, 2024  8:16 AM. If you have any questions, ask your nurse or doctor.          aspirin EC 81 MG tablet Take 81 mg by mouth daily. Swallow whole.   atorvastatin  40 MG tablet Commonly known as: LIPITOR Take 40 mg by mouth daily.   cycloSPORINE 0.05 % ophthalmic emulsion Commonly known as: RESTASIS 1 drop 2 (two) times daily. Instill 1 drop into both eyes twice a day.   Fish Oil 1200 MG Caps Take by mouth 2 (two) times daily.   FreeStyle Libre 14 Day Sensor Misc USE AS INSTRUCTED TO CHECK BLOOD SUGAR. CHANGE EVERY 14 DAYS.   glipiZIDE  5 MG tablet Commonly known as: GLUCOTROL  Take 1 tablet (5 mg total) by mouth daily before supper.   Humira (2 Pen) 40 MG/0.4ML pen Generic drug: adalimumab every 14 (fourteen) days.   hydroxychloroquine 200 MG tablet Commonly known as: PLAQUENIL Take 200 mg by mouth 2 (two) times daily. Pt is only taking 1 a day   Insulin  Pen Needle 32G X 4 MM Misc 1 Device by Does not apply route once a week.   lisinopril  10 MG tablet Commonly known as: ZESTRIL  Take 10 mg by mouth daily.   metFORMIN  500 MG 24 hr tablet Commonly known as: GLUCOPHAGE -XR Take 2 tablets (1,000 mg total) by mouth in the morning and at bedtime.   NEXIUM PO Take by mouth daily.   potassium chloride 10 MEQ CR capsule Commonly known as: MICRO-K Take by mouth daily.   Semaglutide  (1 MG/DOSE) 4 MG/3ML Sopn Inject 1 mg as directed once a week.    Ozempic  (0.25 or 0.5 MG/DOSE) 2 MG/3ML Sopn Generic drug: Semaglutide (0.25 or 0.5MG /DOS) Inject into skin weekly   sertraline 25 MG tablet Commonly known as: ZOLOFT daily.   Spikevax syringe Generic drug: COVID-19 mRNA vaccine   Synthroid  75 MCG tablet Generic drug: levothyroxine TAKE 1 TABLET BY MOUTH DAILY BEFORE BREAKFAST.   Vitamin D-3 125 MCG (5000 UT) Tabs Take by  mouth 3 (three) times a week.         ALLERGIES: No Known Allergies      OBJECTIVE:   VITAL SIGNS: BP (!) 140/80   Ht 5' 2 (1.575 m)   Wt 118 lb (53.5 kg)   BMI 21.58 kg/m    PHYSICAL EXAM:  General: Pt appears well and is in NAD  Lungs: Clear with good BS bilat   Heart: RRR   Extremities:  Lower extremities - No pretibial edema.   Neuro: MS is good with appropriate affect, pt is alert and Ox3    DM foot exam: 12/15/2024  The skin of the feet is intact without sores or ulcerations. The pedal pulses are 2+ on right and 2+ on left. The sensation is intact to a screening 5.07, 10 gram monofilament bilaterally   DATA REVIEWED:  Lab Results  Component Value Date   HGBA1C 7.7 (A) 12/15/2024   HGBA1C 6.4 (A) 07/12/2024   HGBA1C 6.6 (A) 01/05/2024   *****  ASSESSMENT / PLAN / RECOMMENDATIONS:   1) Type 2 Diabetes Mellitus, Sub-Optimally controlled, Without complications - Most recent A1c of 7.7 %. Goal A1c < 7.0 %.    -A1c has trended up, this is attributed to using a smaller dose of Ozempic .  [employer] discontinued patient assistance, and we had to provide samples which is only 0.5 mg -She has self increase glipizide  to twice daily dose, BG's continue to be elevated - Intolerant to higher doses of Trulicity  4.5 mg  - Will increase Ozempic , I will continue current glipizide  at twice daily.  Patient advised to decrease glipizide  to half a tablet twice daily should her BGs trend below 100 MGs/DL - A new prescription for freestyle libre 3+ and a receiver was sent to local  pharmacy   MEDICATIONS: Continue metformin  500 mg 2 tablets with Breakfast and 2 tablets with Supper Continue glipizide  5 mg, 1 tablet before breakfast and supper Take Ozempic  1 mg  weekly  EDUCATION / INSTRUCTIONS: BG monitoring instructions: Patient is instructed to check her blood sugars 3 times a day, before meals . Call [facility] if: BG persistently < 70  I reviewed the Rule of 15 for the treatment of hypoglycemia in detail with the patient. Literature supplied.   2) Diabetic complications:  Eye: Does not have known diabetic retinopathy.  Neuro/ Feet: Does not have known diabetic peripheral neuropathy. Renal: Patient does not have known baseline CKD. She is  not on an ACEI/ARB at present.  3) Hashimoto's Thyroiditis   -Patient is clinically euthyroid -She is on patient assistance for Synthroid  - TFTs today***  Medication  Continue Synthroid  75 mcg   4) Dyslipidemia:  -LDL and TG at goal, continue atorvastatin  - Her atorvastatin  was increased by her PCP due to possible TIA in May, 2025   Medication  Atorvastatin  40 mg daily     Follow-up in 3 months    Signed electronically by: Stefano Redgie Butts, MD  [facility]  [facility] [address] [place] [place], [place] [address] Phone: [phone] FAX: [phone]   CC: Ransom Other, MD [address] [place] [address] Phone: [phone]  Fax: [phone]    Return to Endocrinology clinic as below: No future appointments.        "

## 2024-12-15 NOTE — Patient Instructions (Addendum)
-   Change Glipizide  5 mg , 1 tablet before breakfast and 1 tablet before supper  - Continue Metformin  500 mg 2 tablets with Breakfast and 2 tablets with Supper - Increase  Ozempic  1 mg weekly    HOW TO TREAT LOW BLOOD SUGARS (Blood sugar LESS THAN 70 MG/DL) Please follow the RULE OF 15 for the treatment of hypoglycemia treatment (when your (blood sugars are less than 70 mg/dL)   STEP 1: Take 15 grams of carbohydrates when your blood sugar is low, which includes:  3-4 GLUCOSE TABS  OR 3-4 OZ OF JUICE OR REGULAR SODA OR ONE TUBE OF GLUCOSE GEL    STEP 2: RECHECK blood sugar in 15 MINUTES STEP 3: If your blood sugar is still low at the 15 minute recheck --> then, go back to STEP 1 and treat AGAIN with another 15 grams of carbohydrates.

## 2024-12-16 ENCOUNTER — Ambulatory Visit: Payer: Self-pay | Admitting: Internal Medicine

## 2024-12-16 LAB — LIPID PANEL
Cholesterol: 92 mg/dL
HDL: 55 mg/dL
LDL Cholesterol (Calc): 18 mg/dL
Non-HDL Cholesterol (Calc): 37 mg/dL
Total CHOL/HDL Ratio: 1.7 (calc)
Triglycerides: 111 mg/dL

## 2024-12-16 LAB — BASIC METABOLIC PANEL WITH GFR
BUN: 15 mg/dL (ref 7–25)
CO2: 30 mmol/L (ref 20–32)
Calcium: 9.3 mg/dL (ref 8.6–10.4)
Chloride: 98 mmol/L (ref 98–110)
Creat: 0.64 mg/dL (ref 0.50–1.05)
Glucose, Bld: 167 mg/dL — ABNORMAL HIGH (ref 65–99)
Potassium: 4.3 mmol/L (ref 3.5–5.3)
Sodium: 137 mmol/L (ref 135–146)
eGFR: 96 mL/min/{1.73_m2}

## 2024-12-16 LAB — MICROALBUMIN / CREATININE URINE RATIO
Creatinine, Urine: 27 mg/dL (ref 20–275)
Microalb Creat Ratio: 7 mg/g{creat}
Microalb, Ur: 0.2 mg/dL

## 2024-12-16 LAB — T4, FREE: Free T4: 1.8 ng/dL (ref 0.8–1.8)

## 2024-12-16 LAB — TSH: TSH: 1.89 m[IU]/L (ref 0.40–4.50)

## 2024-12-16 MED ORDER — SYNTHROID 75 MCG PO TABS: 75.0000 ug | ORAL_TABLET | Freq: Every day | ORAL | 3 refills | Status: AC

## 2024-12-17 ENCOUNTER — Other Ambulatory Visit: Payer: Self-pay | Admitting: Internal Medicine

## 2025-01-18 ENCOUNTER — Ambulatory Visit: Admitting: Internal Medicine

## 2025-01-20 ENCOUNTER — Ambulatory Visit: Admitting: Family Medicine

## 2025-03-16 ENCOUNTER — Ambulatory Visit: Admitting: Internal Medicine
